# Patient Record
Sex: Female | Born: 1985 | Race: Black or African American | Hispanic: No | Marital: Single | Smoking: Current every day smoker
Health system: Southern US, Community
[De-identification: ages and names within clinical notes are randomized; demographics above are authoritative.]

---

## 2005-05-17 ENCOUNTER — Emergency Department: Payer: Self-pay | Admitting: Emergency Medicine

## 2006-01-13 ENCOUNTER — Emergency Department: Payer: Self-pay | Admitting: Internal Medicine

## 2006-04-06 ENCOUNTER — Emergency Department: Payer: Self-pay | Admitting: Emergency Medicine

## 2007-09-08 ENCOUNTER — Emergency Department: Payer: Self-pay | Admitting: Emergency Medicine

## 2010-06-13 ENCOUNTER — Emergency Department: Payer: Self-pay | Admitting: Emergency Medicine

## 2010-06-15 ENCOUNTER — Emergency Department: Payer: Self-pay | Admitting: Emergency Medicine

## 2011-10-13 ENCOUNTER — Emergency Department: Payer: Self-pay | Admitting: Unknown Physician Specialty

## 2011-10-20 ENCOUNTER — Emergency Department: Payer: Self-pay | Admitting: Emergency Medicine

## 2011-11-24 ENCOUNTER — Emergency Department: Payer: Self-pay | Admitting: Emergency Medicine

## 2011-11-24 LAB — CBC
HCT: 43.1 % (ref 35.0–47.0)
MCHC: 31.1 g/dL — ABNORMAL LOW (ref 32.0–36.0)
Platelet: 275 10*3/uL (ref 150–440)
RDW: 13.4 % (ref 11.5–14.5)
WBC: 9.2 10*3/uL (ref 3.6–11.0)

## 2011-11-24 LAB — COMPREHENSIVE METABOLIC PANEL
Albumin: 3.8 g/dL (ref 3.4–5.0)
Alkaline Phosphatase: 65 U/L (ref 50–136)
Anion Gap: 8 (ref 7–16)
BUN: 9 mg/dL (ref 7–18)
Co2: 24 mmol/L (ref 21–32)
Creatinine: 0.76 mg/dL (ref 0.60–1.30)
EGFR (African American): >60
Glucose: 80 mg/dL (ref 65–99)
Potassium: 4.2 mmol/L (ref 3.5–5.1)
SGOT(AST): 26 U/L (ref 15–37)
SGPT (ALT): 25 U/L
Total Protein: 8.1 g/dL (ref 6.4–8.2)

## 2011-11-24 LAB — URINALYSIS, COMPLETE
Bilirubin,UR: NEGATIVE
Glucose,UR: NEGATIVE mg/dL (ref 0–75)
Ketone: NEGATIVE
Leukocyte Esterase: NEGATIVE
Nitrite: NEGATIVE
Ph: 6 (ref 4.5–8.0)
WBC UR: 2 /HPF (ref 0–5)

## 2012-07-11 ENCOUNTER — Emergency Department: Payer: Self-pay | Admitting: Emergency Medicine

## 2012-07-11 LAB — COMPREHENSIVE METABOLIC PANEL
Alkaline Phosphatase: 73 U/L (ref 50–136)
BUN: 10 mg/dL (ref 7–18)
Bilirubin,Total: 0.3 mg/dL (ref 0.2–1.0)
Co2: 27 mmol/L (ref 21–32)
Creatinine: 0.7 mg/dL (ref 0.60–1.30)
Glucose: 80 mg/dL (ref 65–99)
Osmolality: 277 (ref 275–301)
SGOT(AST): 30 U/L (ref 15–37)
Sodium: 140 mmol/L (ref 136–145)
Total Protein: 8.4 g/dL — ABNORMAL HIGH (ref 6.4–8.2)

## 2012-07-11 LAB — CBC
HGB: 13.6 g/dL (ref 12.0–16.0)
MCHC: 32.7 g/dL (ref 32.0–36.0)
Platelet: 285 10*3/uL (ref 150–440)
WBC: 5.9 10*3/uL (ref 3.6–11.0)

## 2012-07-11 LAB — URINALYSIS, COMPLETE
Ketone: NEGATIVE
Specific Gravity: 1.029 (ref 1.003–1.030)

## 2013-01-19 ENCOUNTER — Emergency Department: Payer: Self-pay | Admitting: Emergency Medicine

## 2013-01-22 ENCOUNTER — Emergency Department: Payer: Self-pay | Admitting: Emergency Medicine

## 2013-07-10 ENCOUNTER — Ambulatory Visit: Payer: Self-pay | Admitting: Physician Assistant

## 2013-10-19 ENCOUNTER — Observation Stay: Payer: Self-pay

## 2013-10-23 ENCOUNTER — Inpatient Hospital Stay: Payer: Self-pay | Admitting: Obstetrics and Gynecology

## 2013-10-23 LAB — CBC WITH DIFFERENTIAL/PLATELET
Basophil #: 0.1 10*3/uL (ref 0.0–0.1)
Basophil %: 0.5 %
EOS PCT: 0.6 %
Eosinophil #: 0.1 10*3/uL (ref 0.0–0.7)
HCT: 39.8 % (ref 35.0–47.0)
HGB: 13.3 g/dL (ref 12.0–16.0)
Lymphocyte #: 2.7 10*3/uL (ref 1.0–3.6)
Lymphocyte %: 24.9 %
MCH: 30.6 pg (ref 26.0–34.0)
MCHC: 33.4 g/dL (ref 32.0–36.0)
MCV: 92 fL (ref 80–100)
MONOS PCT: 9.5 %
Monocyte #: 1 x10 3/mm — ABNORMAL HIGH (ref 0.2–0.9)
NEUTROS PCT: 64.5 %
Neutrophil #: 7.1 10*3/uL — ABNORMAL HIGH (ref 1.4–6.5)
PLATELETS: 174 10*3/uL (ref 150–440)
RBC: 4.33 10*6/uL (ref 3.80–5.20)
RDW: 13.5 % (ref 11.5–14.5)
WBC: 11 10*3/uL (ref 3.6–11.0)

## 2013-10-25 LAB — HEMATOCRIT: HCT: 34.8 % — ABNORMAL LOW (ref 35.0–47.0)

## 2013-10-28 ENCOUNTER — Emergency Department: Payer: Self-pay | Admitting: Emergency Medicine

## 2013-10-28 LAB — URINALYSIS, COMPLETE
BILIRUBIN, UR: NEGATIVE
Bacteria: NONE SEEN
Glucose,UR: NEGATIVE mg/dL (ref 0–75)
KETONE: NEGATIVE
Nitrite: NEGATIVE
PH: 6 (ref 4.5–8.0)
PROTEIN: NEGATIVE
RBC,UR: 15 /HPF (ref 0–5)
Specific Gravity: 1.017 (ref 1.003–1.030)
Squamous Epithelial: <1

## 2013-10-28 LAB — COMPREHENSIVE METABOLIC PANEL
Albumin: 2.7 g/dL — ABNORMAL LOW (ref 3.4–5.0)
Alkaline Phosphatase: 157 U/L — ABNORMAL HIGH
Anion Gap: 5 — ABNORMAL LOW (ref 7–16)
BILIRUBIN TOTAL: 0.3 mg/dL (ref 0.2–1.0)
BUN: 11 mg/dL (ref 7–18)
CO2: 25 mmol/L (ref 21–32)
Calcium, Total: 9.1 mg/dL (ref 8.5–10.1)
Chloride: 106 mmol/L (ref 98–107)
Creatinine: 0.51 mg/dL — ABNORMAL LOW (ref 0.60–1.30)
EGFR (African American): >60
EGFR (Non-African Amer.): >60
GLUCOSE: 94 mg/dL (ref 65–99)
OSMOLALITY: 271 (ref 275–301)
POTASSIUM: 3.6 mmol/L (ref 3.5–5.1)
SGOT(AST): 37 U/L (ref 15–37)
SGPT (ALT): 30 U/L (ref 12–78)
SODIUM: 136 mmol/L (ref 136–145)
Total Protein: 7.3 g/dL (ref 6.4–8.2)

## 2013-10-28 LAB — CBC
HCT: 36.8 % (ref 35.0–47.0)
HGB: 12.4 g/dL (ref 12.0–16.0)
MCH: 30.7 pg (ref 26.0–34.0)
MCHC: 33.7 g/dL (ref 32.0–36.0)
MCV: 91 fL (ref 80–100)
Platelet: 242 10*3/uL (ref 150–440)
RBC: 4.03 10*6/uL (ref 3.80–5.20)
RDW: 13.4 % (ref 11.5–14.5)
WBC: 10.3 10*3/uL (ref 3.6–11.0)

## 2013-10-28 LAB — URIC ACID: Uric Acid: 5 mg/dL (ref 2.6–6.0)

## 2013-10-28 LAB — LIPASE, BLOOD: LIPASE: 87 U/L (ref 73–393)

## 2013-10-28 LAB — MAGNESIUM: Magnesium: 1.6 mg/dL — ABNORMAL LOW
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
## 2014-10-20 NOTE — H&P (Signed)
L&D Evaluation:  History:  HPI 29 yo G3P1011 with LMP of ?5/15 & 10/28/13 at 24 2/7 weeks [facility] significant for Polyhydramnios, unsure dating, late entry to care, PICA, dental concerns, recent smoker, H/o seizure x 1 (2000), social stressors, Edinburgh score of 11. [facility].   Presents with contractions   Patient's Medical History Dental  issues, Seizure   Patient's Surgical History none   Medications Pre Natal Vitamins   Allergies NKDA   Social History none   Family History Non-Contributory   ROS:  ROS All systems were reviewed.  HEENT, CNS, GI, GU, Respiratory, CV, Renal and Musculoskeletal systems were found to be normal.   Exam:  Vital Signs stable   General no apparent distress   Mental Status clear   Chest clear   Heart normal sinus rhythm, no murmur/gallop/rubs   Estimated Fetal Weight Average for gestational age   Fetal Position vtx   Back no CVAT   Edema 1+   Mebranes Intact   Ucx irregular   Skin dry   Lymph no lymphadenopathy   Impression:  Impression IUP at 38 5/7 weeks   Plan:  Plan Pt is ambulating   Electronic Signatures: Sharee PimpleJones, Caron W (CNM)  (Signed 10-May-15 09:25)  Authored: L&D Evaluation   Last Updated: 10-May-15 09:25 by Sharee PimpleJones, Caron W (CNM)

## 2014-10-20 NOTE — H&P (Signed)
L&D Evaluation:  History:  HPI 29 yo G3P1011 wtih LMP of 01/23/13 & EDD of 10/28/13 at [facility] for unsure dating, late entry to care, PICA, dental concerns, H/O "seizure x 1 (2000), social stressors, [place]: 11 at entry of care, S>D. Presents this pm for IOL due to repeat visits for B-H's, pt preference and unsure dating. Pt initially was given a 10/09/13 EDD and it was changed to 10/24/13 & then 10/28/13. No ROM,VB, decreased FM or UC's. IOL was scheduled and approved by Dr Christen BameSchermerhron. Dr. Bonney AidStaebler aware of pt status and plan of care and on back-up/   Presents with IOL   Patient's Medical History Seizure x 1 in 2000, Anemia, social stressors , Depression   Patient's Surgical History none   Medications Pre Natal Vitamins   Allergies NKDA   Social History tobacco  EtOH  quit 03/2013   Family History Non-Contributory   ROS:  ROS All systems were reviewed.  HEENT, CNS, GI, GU, Respiratory, CV, Renal and Musculoskeletal systems were found to be normal.   Exam:  Vital Signs stable   General no apparent distress   Mental Status clear   Chest clear   Heart normal sinus rhythm, no murmur/gallop/rubs   Abdomen gravid, non-tender   Estimated Fetal Weight Average for gestational age   Fetal Position vtx -2   Back no CVAT   Edema 1+   Reflexes 1+   Clonus negative   Pelvic 3/80/vtx   FHT normal rate with no decels   Ucx irregular   Skin dry   Lymph no lymphadenopathy   Impression:  Impression IUP at term for IOL   Plan:  Plan monitor contractions and for cervical change, Disc IOL with AROM   Electronic Signatures: Sharee PimpleJones, Caron W (CNM)  (Signed 14-May-15 22:04)  Authored: L&D Evaluation   Last Updated: 14-May-15 22:04 by Sharee PimpleJones, Caron W (CNM)

## 2014-11-02 ENCOUNTER — Emergency Department
Admission: EM | Admit: 2014-11-02 | Discharge: 2014-11-02 | Disposition: A | Discharge status: Home / Self Care | Payer: Self-pay | Attending: Emergency Medicine | Admitting: Emergency Medicine

## 2014-11-02 ENCOUNTER — Encounter: Payer: Self-pay | Admitting: *Deleted

## 2014-11-02 DIAGNOSIS — H109 Unspecified conjunctivitis: Secondary | ICD-10-CM | POA: Insufficient documentation

## 2014-11-02 DIAGNOSIS — Z720 Tobacco use: Secondary | ICD-10-CM | POA: Insufficient documentation

## 2014-11-02 DIAGNOSIS — K047 Periapical abscess without sinus: Secondary | ICD-10-CM | POA: Insufficient documentation

## 2014-11-02 MED ORDER — SULFACETAMIDE SODIUM 10 % OP SOLN: 2.0000 [drp] | Freq: Four times a day (QID) | OPHTHALMIC | Status: DC

## 2014-11-02 MED ORDER — HYDROCODONE-ACETAMINOPHEN 5-325 MG PO TABS: 1.0000 | ORAL_TABLET | ORAL | Status: DC | PRN

## 2014-11-02 MED ORDER — AMOXICILLIN 500 MG PO TABS: 500.0000 mg | ORAL_TABLET | Freq: Three times a day (TID) | ORAL | Status: DC

## 2014-11-02 MED ORDER — IBUPROFEN 800 MG PO TABS
ORAL_TABLET | ORAL | Status: AC
  Administered 2014-11-02: 800 mg via ORAL
  Filled 2014-11-02: qty 1

## 2014-11-02 MED ORDER — HYDROCODONE-ACETAMINOPHEN 5-325 MG PO TABS
ORAL_TABLET | ORAL | Status: AC
  Filled 2014-11-02: qty 2

## 2014-11-02 MED ORDER — IBUPROFEN 800 MG PO TABS: 800.0000 mg | ORAL_TABLET | Freq: Three times a day (TID) | ORAL | Status: DC | PRN

## 2014-11-02 MED ORDER — IBUPROFEN 800 MG PO TABS
800.0000 mg | ORAL_TABLET | Freq: Once | ORAL | Status: AC
  Administered 2014-11-02: 800 mg via ORAL

## 2014-11-02 MED ORDER — HYDROCODONE-ACETAMINOPHEN 5-325 MG PO TABS
2.0000 | ORAL_TABLET | Freq: Once | ORAL | Status: AC
  Administered 2014-11-02: 2 via ORAL

## 2014-11-02 NOTE — ED Notes (Signed)
Pt c/o dental pain for years, worsening last night at 2200, unable to sleep. Pt states she took Excedrin x 6 tabs w/o relief in the past 7 hrs.

## 2014-11-02 NOTE — ED Provider Notes (Signed)
[facility] Emergency Department Provider Note  ____________________________________________  Time seen: Approximately 8:04 AM  I have reviewed the triage vital signs and the nursing notes.   HISTORY  Chief Complaint Dental Pain    HPI Maria Gillespie is a 29 y.o. female presents for evaluation of dental pain. States had sudden onset last night around 10:00 unable to sleep all night. Rates pain as a 10 out of 10. In addition patient notes that her right eyes. Red with drainage and matting. That was sudden onset this morning  History reviewed. No pertinent past medical history.  There are no active problems to display for this patient.   History reviewed. No pertinent past surgical history.  Current Outpatient Rx  Name  Route  Sig  Dispense  Refill  . amoxicillin (AMOXIL) 500 MG tablet   Oral   Take 1 tablet (500 mg total) by mouth 3 (three) times daily.   30 tablet   0   . HYDROcodone-acetaminophen (NORCO) 5-325 MG per tablet   Oral   Take 1 tablet by mouth every 4 (four) hours as needed for moderate pain.   12 tablet   0   . ibuprofen (ADVIL,MOTRIN) 800 MG tablet   Oral   Take 1 tablet (800 mg total) by mouth every 8 (eight) hours as needed.   30 tablet   0   . sulfacetamide (BLEPH-10) 10 % ophthalmic solution   Both Eyes   Place 2 drops into both eyes 4 (four) times daily.   5 mL   0     Allergies Review of patient's allergies indicates no known allergies.  History reviewed. No pertinent family history.  Social History History  Substance Use Topics  . Smoking status: Current Every Day Smoker    Types: Cigarettes  . Smokeless tobacco: Never Used  . Alcohol Use: Yes     Comment: 1 shot last night, usually only drinks occasionally    Review of Systems Constitutional: No fever/chills Eyes: No visual changes. Erythematous eyes  ENT: No sore throat. Positive dental pain. Cardiovascular: Denies chest pain. Respiratory: Denies  shortness of breath. Genitourinary: Negative for dysuria. Musculoskeletal: Negative for back pain. Skin: Negative for rash. Neurological: Negative for headaches, focal weakness or numbness.  10-point ROS otherwise negative.  ____________________________________________   PHYSICAL EXAM:  VITAL SIGNS: ED Triage Vitals  Enc Vitals Group     BP 11/02/14 0525 108/69 mmHg     Pulse Rate 11/02/14 0525 61     Resp 11/02/14 0525 20     Temp 11/02/14 0525 98.9 F (37.2 C)     Temp Source 11/02/14 0525 Oral     SpO2 11/02/14 0525 99 %     Weight --      Height 11/02/14 0525  (1.803 m)     Head Cir --      Peak Flow --      Pain Score 11/02/14 0526 10     Pain Loc --      Pain Edu? --      Excl. in GC? --     Constitutional: Alert and oriented. Well appearing and in no acute distress. Eyes: Right eye very erythematous with greenish drainage noted. Pupils equal round reactive to light and accommodation EOMI Mouth/Throat: Mucous membranes are moist.  Oropharynx non-erythematous. Multiple dental caries noted. With some gum irritation erythema. Neck: No stridor.  No cervical adenopathy. Neurologic:  Normal speech and language. No gross focal neurologic deficits are appreciated. Speech  is normal. No gait instability. Skin:  Skin is warm, dry and intact. No rash noted. Psychiatric: Mood and affect are normal. Speech and behavior are normal.  ____________________________________________   LABS (all labs ordered are listed, but only abnormal results are displayed)  Labs Reviewed - No data to display ____________________________________________  EKG  None ____________________________________________  RADIOLOGY  None ____________________________________________   PROCEDURES  Procedure(s) performed: None  Critical Care performed: No  ____________________________________________   INITIAL IMPRESSION / ASSESSMENT AND PLAN / ED COURSE  Pertinent labs & imaging  results that were available during my care of the patient were reviewed by me and considered in my medical decision making (see chart for details).  Diagnosis with dental caries. Plan will be to treat with amoxicillin and hydrocodone ibuprofen for pain. In addition patient has conjunctivitis and treated with sodium Sulamyd eyedrops. She understands return to the ER if symptoms worsen. ____________________________________________   FINAL CLINICAL IMPRESSION(S) / ED DIAGNOSES  Final diagnoses:  Dental abscess  Conjunctivitis of right eye      Evangeline DakinCharles M Tashauna Caisse, PA-C 11/02/14 1512  Jene Everyobert Kinner, MD 11/04/14 1258

## 2014-11-02 NOTE — Discharge Instructions (Signed)
Abscessed Tooth An abscessed tooth is an infection around your tooth. It may be caused by holes or damage to the tooth (cavity) or a dental disease. An abscessed tooth causes mild to very bad pain in and around the tooth. See your dentist right away if you have tooth or gum pain. HOME CARE  Take your medicine as told. Finish it even if you start to feel better.  Do not drive after taking pain medicine.  Rinse your mouth (gargle) often with salt water ( teaspoon salt in 8 ounces of warm water).  Do not apply heat to the outside of your face. GET HELP RIGHT AWAY IF:   You have a temperature by mouth above 102 F (38.9 C), not controlled by medicine.  You have chills and a very bad headache.  You have problems breathing or swallowing.  Your mouth will not open.  You develop puffiness (swelling) on the neck or around the eye.  Your pain is not helped by medicine.  Your pain is getting worse instead of better. MAKE SURE YOU:   Understand these instructions.  Will watch your condition.  Will get help right away if you are not doing well or get worse. Document Released: 11/15/2007 Document Revised: 08/21/2011 Document Reviewed: 09/06/2010 [facility] Patient Information 2015 [place], [place]. This information is not intended to replace advice given to you by your health care provider. Make sure you discuss any questions you have with your health care provider.  Conjunctivitis Conjunctivitis is commonly called "pink eye." Conjunctivitis can be caused by bacterial or viral infection, allergies, or injuries. There is usually redness of the lining of the eye, itching, discomfort, and sometimes discharge. There may be deposits of matter along the eyelids. A viral infection usually causes a watery discharge, while a bacterial infection causes a yellowish, thick discharge. Pink eye is very contagious and spreads by direct contact. You may be given antibiotic eyedrops as part of your treatment.  Before using your eye medicine, remove all drainage from the eye by washing gently with warm water and cotton balls. Continue to use the medication until you have awakened 2 mornings in a row without discharge from the eye. Do not rub your eye. This increases the irritation and helps spread infection. Use separate towels from other household members. Wash your hands with soap and water before and after touching your eyes. Use cold compresses to reduce pain and sunglasses to relieve irritation from light. Do not wear contact lenses or wear eye makeup until the infection is gone. SEEK MEDICAL CARE IF:   Your symptoms are not better after 3 days of treatment.  You have increased pain or trouble seeing.  The outer eyelids become very red or swollen. Document Released: 07/06/2004 Document Revised: 08/21/2011 Document Reviewed: 05/29/2005 [facility] Patient Information 2015 [place], [place]. This information is not intended to replace advice given to you by your health care provider. Make sure you discuss any questions you have with your health care provider.

## 2014-11-05 MED ORDER — METOCLOPRAMIDE HCL 5 MG/ML IJ SOLN
INTRAMUSCULAR | Status: AC
  Filled 2014-11-05: qty 2

## 2015-08-10 ENCOUNTER — Encounter: Payer: Self-pay | Admitting: Emergency Medicine

## 2015-08-10 ENCOUNTER — Emergency Department
Admission: EM | Admit: 2015-08-10 | Discharge: 2015-08-10 | Disposition: A | Discharge status: Home / Self Care | Payer: Self-pay | Attending: Emergency Medicine | Admitting: Emergency Medicine

## 2015-08-10 DIAGNOSIS — F17210 Nicotine dependence, cigarettes, uncomplicated: Secondary | ICD-10-CM | POA: Insufficient documentation

## 2015-08-10 DIAGNOSIS — R197 Diarrhea, unspecified: Secondary | ICD-10-CM | POA: Insufficient documentation

## 2015-08-10 DIAGNOSIS — R112 Nausea with vomiting, unspecified: Secondary | ICD-10-CM | POA: Insufficient documentation

## 2015-08-10 DIAGNOSIS — B9789 Other viral agents as the cause of diseases classified elsewhere: Secondary | ICD-10-CM | POA: Insufficient documentation

## 2015-08-10 DIAGNOSIS — A0811 Acute gastroenteropathy due to Norwalk agent: Secondary | ICD-10-CM

## 2015-08-10 DIAGNOSIS — Z792 Long term (current) use of antibiotics: Secondary | ICD-10-CM | POA: Insufficient documentation

## 2015-08-10 LAB — CBC WITH DIFFERENTIAL/PLATELET
BASOS ABS: 0 10*3/uL (ref 0–0.1)
Basophils Relative: 0 %
Eosinophils Absolute: 0 10*3/uL (ref 0–0.7)
Eosinophils Relative: 0 %
HEMATOCRIT: 46.4 % (ref 35.0–47.0)
HEMOGLOBIN: 15.6 g/dL (ref 12.0–16.0)
LYMPHS ABS: 0.8 10*3/uL — AB (ref 1.0–3.6)
LYMPHS PCT: 7 %
MCH: 29.6 pg (ref 26.0–34.0)
MCHC: 33.6 g/dL (ref 32.0–36.0)
MCV: 88.1 fL (ref 80.0–100.0)
Monocytes Absolute: 0.5 10*3/uL (ref 0.2–0.9)
Monocytes Relative: 5 %
NEUTROS ABS: 10 10*3/uL — AB (ref 1.4–6.5)
NEUTROS PCT: 88 %
Platelets: 263 10*3/uL (ref 150–440)
RBC: 5.27 MIL/uL — AB (ref 3.80–5.20)
RDW: 13.3 % (ref 11.5–14.5)
WBC: 11.4 10*3/uL — AB (ref 3.6–11.0)

## 2015-08-10 LAB — COMPREHENSIVE METABOLIC PANEL
ALBUMIN: 4.3 g/dL (ref 3.5–5.0)
ALT: 32 U/L (ref 14–54)
AST: 31 U/L (ref 15–41)
Alkaline Phosphatase: 51 U/L (ref 38–126)
Anion gap: 9 (ref 5–15)
BUN: 12 mg/dL (ref 6–20)
CHLORIDE: 108 mmol/L (ref 101–111)
CO2: 21 mmol/L — ABNORMAL LOW (ref 22–32)
Calcium: 9 mg/dL (ref 8.9–10.3)
Creatinine, Ser: 0.78 mg/dL (ref 0.44–1.00)
Glucose, Bld: 101 mg/dL — ABNORMAL HIGH (ref 65–99)
Potassium: 3.8 mmol/L (ref 3.5–5.1)
Sodium: 138 mmol/L (ref 135–145)
TOTAL PROTEIN: 8.4 g/dL — AB (ref 6.5–8.1)
Total Bilirubin: 0.7 mg/dL (ref 0.3–1.2)

## 2015-08-10 MED ORDER — ONDANSETRON 4 MG PO TBDP: 4.0000 mg | ORAL_TABLET | Freq: Three times a day (TID) | ORAL | Status: DC | PRN

## 2015-08-10 MED ORDER — SODIUM CHLORIDE 0.9 % IV BOLUS (SEPSIS)
1000.0000 mL | Freq: Once | INTRAVENOUS | Status: AC
  Administered 2015-08-10: 1000 mL via INTRAVENOUS

## 2015-08-10 MED ORDER — LOPERAMIDE HCL 2 MG PO TABS: 2.0000 mg | ORAL_TABLET | Freq: Four times a day (QID) | ORAL | Status: DC | PRN

## 2015-08-10 MED ORDER — ONDANSETRON HCL 4 MG/2ML IJ SOLN
4.0000 mg | Freq: Once | INTRAMUSCULAR | Status: AC
  Administered 2015-08-10: 4 mg via INTRAVENOUS
  Filled 2015-08-10: qty 2

## 2015-08-10 NOTE — ED Provider Notes (Signed)
[facility] Emergency Department Provider Note  ____________________________________________  Time seen: Approximately 11:00 AM  I have reviewed the triage vital signs and the nursing notes.   HISTORY  Chief Complaint Emesis and Diarrhea    HPI Maria Gillespie is a 30 y.o. female presents emergency department complaining of nausea, vomiting, diarrhea began this morning. Patient states that she had no symptoms yesterday but all started around 5:30 this morning. Patient denies any abdominal pain or fevers or chills with this. She states that the vomiting is nonbilious. Diarrhea is not mucoid and nonbloody.   History reviewed. No pertinent past medical history.  There are no active problems to display for this patient.   History reviewed. No pertinent past surgical history.  Current Outpatient Rx  Name  Route  Sig  Dispense  Refill  . amoxicillin (AMOXIL) 500 MG tablet   Oral   Take 1 tablet (500 mg total) by mouth 3 (three) times daily.   30 tablet   0   . HYDROcodone-acetaminophen (NORCO) 5-325 MG per tablet   Oral   Take 1 tablet by mouth every 4 (four) hours as needed for moderate pain.   12 tablet   0   . ibuprofen (ADVIL,MOTRIN) 800 MG tablet   Oral   Take 1 tablet (800 mg total) by mouth every 8 (eight) hours as needed.   30 tablet   0   . loperamide (IMODIUM A-D) 2 MG tablet   Oral   Take 1 tablet (2 mg total) by mouth 4 (four) times daily as needed for diarrhea or loose stools.   30 tablet   0   . ondansetron (ZOFRAN-ODT) 4 MG disintegrating tablet   Oral   Take 1 tablet (4 mg total) by mouth every 8 (eight) hours as needed for nausea or vomiting.   20 tablet   0   . sulfacetamide (BLEPH-10) 10 % ophthalmic solution   Both Eyes   Place 2 drops into both eyes 4 (four) times daily.   5 mL   0     Allergies Review of patient's allergies indicates no known allergies.  No family history on file.  Social History Social  History  Substance Use Topics  . Smoking status: Current Every Day Smoker -- 0.50 packs/day    Types: Cigarettes  . Smokeless tobacco: Never Used  . Alcohol Use: Yes     Comment: occas.      Review of Systems  Constitutional: No fever/chills ENT: No sore throat. Cardiovascular: no chest pain. Respiratory: no cough. No SOB. Gastrointestinal: No abdominal pain.  Positive for nausea/vomiting and diarrhea..  No constipation. Genitourinary: Negative for dysuria. No hematuria Musculoskeletal: Negative for back pain. Skin: Negative for rash. Neurological: Negative for headaches, focal weakness or numbness. 10-point ROS otherwise negative.  ____________________________________________   PHYSICAL EXAM:  VITAL SIGNS: ED Triage Vitals  Enc Vitals Group     BP 08/10/15 0933 111/55 mmHg     Pulse Rate 08/10/15 0933 78     Resp 08/10/15 0933 18     Temp 08/10/15 0933 98.4 F (36.9 C)     Temp Source 08/10/15 0933 Oral     SpO2 08/10/15 0933 100 %     Weight 08/10/15 0928 150 lb (68.04 kg)     Height 08/10/15 0928  (1.778 m)     Head Cir --      Peak Flow --      Pain Score 08/10/15 0928 8  Pain Loc --      Pain Edu? --      Excl. in GC? --      Constitutional: Alert and oriented. Well appearing and in no acute distress. Eyes: Conjunctivae are normal. PERRL. EOMI. Head: Atraumatic. Neck: No stridor.   Hematological/Lymphatic/Immunilogical: No cervical lymphadenopathy. Cardiovascular: Normal rate, regular rhythm. Normal S1 and S2.  Good peripheral circulation. Respiratory: Normal respiratory effort without tachypnea or retractions. Lungs CTAB. Gastrointestinal: Bowel sounds 4 quadrants. Soft and nontender. No guarding or rigidity. No distention. No CVA tenderness. Neurologic:  Normal speech and language. No gross focal neurologic deficits are appreciated.  Skin:  Skin is warm, dry and intact. No rash noted. Psychiatric: Mood and affect are normal. Speech and  behavior are normal. Patient exhibits appropriate insight and judgement.   ____________________________________________   LABS (all labs ordered are listed, but only abnormal results are displayed)  Labs Reviewed  COMPREHENSIVE METABOLIC PANEL - Abnormal; Notable for the following:    CO2 21 (*)    Glucose, Bld 101 (*)    Total Protein 8.4 (*)    All other components within normal limits  CBC WITH DIFFERENTIAL/PLATELET - Abnormal; Notable for the following:    WBC 11.4 (*)    RBC 5.27 (*)    Neutro Abs 10.0 (*)    Lymphs Abs 0.8 (*)    All other components within normal limits   ____________________________________________  EKG   ____________________________________________  RADIOLOGY   No results found.  ____________________________________________    PROCEDURES  Procedure(s) performed:       Medications  sodium chloride 0.9 % bolus 1,000 mL (1,000 mLs Intravenous New Bag/Given 08/10/15 1011)  ondansetron (ZOFRAN) injection 4 mg (4 mg Intravenous Given 08/10/15 1011)     ____________________________________________   INITIAL IMPRESSION / ASSESSMENT AND PLAN / ED COURSE  Pertinent labs & imaging results that were available during my care of the patient were reviewed by me and considered in my medical decision making (see chart for details).  Patient's diagnosis is consistent with normal virus. Patient's exam and labs are reassuring. Patient was given fluids and Zofran here in the emergency department with improvement of her symptoms.. Patient will be discharged home with prescriptions for Zofran and loperamide. Patient is to take probiotics over-the-counter for additional symptom control.. Patient is to follow up with primary care provider if symptoms persist past this treatment course. Patient is given ED precautions to return to the ED for any worsening or new symptoms.     ____________________________________________  FINAL CLINICAL IMPRESSION(S) /  ED DIAGNOSES  Final diagnoses:  Norovirus      NEW MEDICATIONS STARTED DURING THIS VISIT:  New Prescriptions   LOPERAMIDE (IMODIUM A-D) 2 MG TABLET    Take 1 tablet (2 mg total) by mouth 4 (four) times daily as needed for diarrhea or loose stools.   ONDANSETRON (ZOFRAN-ODT) 4 MG DISINTEGRATING TABLET    Take 1 tablet (4 mg total) by mouth every 8 (eight) hours as needed for nausea or vomiting.        Delorise Royals Cuthriell, PA-C 08/10/15 1104  Myrna Blazer, MD 08/10/15 [phone]

## 2015-08-10 NOTE — Discharge Instructions (Signed)
Norovirus Infection A norovirus infection is caused by exposure to a virus in a group of similar viruses (noroviruses). This type of infection causes inflammation in your stomach and intestines (gastroenteritis). Norovirus is the most common cause of gastroenteritis. It also causes food poisoning. Anyone can get a norovirus infection. It spreads very easily (contagious). You can get it from contaminated food, water, surfaces, or other people. Norovirus is found in the stool or vomit of infected people. You can spread the infection as soon as you feel sick until 2 weeks after you recover.  Symptoms usually begin within 2 days after you become infected. Most norovirus symptoms affect the digestive system. CAUSES Norovirus infection is caused by contact with norovirus. You can catch norovirus if you:  Eat or drink something contaminated with norovirus.  Touch surfaces or objects contaminated with norovirus and then put your hand in your mouth.  Have direct contact with an infected person who has symptoms.  Share food, drink, or utensils with someone with who is sick with norovirus. SIGNS AND SYMPTOMS Symptoms of norovirus may include:  Nausea.  Vomiting.  Diarrhea.  Stomach cramps.  Fever.  Chills.  Headache.  Muscle aches.  Tiredness. DIAGNOSIS Your health care provider may suspect norovirus based on your symptoms and physical exam. Your health care provider may also test a sample of your stool or vomit for the virus.  TREATMENT There is no specific treatment for norovirus. Most people get better without treatment in about 2 days. HOME CARE INSTRUCTIONS  Replace lost fluids by drinking plenty of water or rehydration fluids containing important minerals called electrolytes. This prevents dehydration. Drink enough fluid to keep your urine clear or pale yellow.  Do not prepare food for others while you are infected. Wait at least 3 days after recovering from the illness to do  that. PREVENTION   Wash your hands often, especially after using the toilet or changing a diaper.  Wash fruits and vegetables thoroughly before preparing or serving them.  Throw out any food that a sick person may have touched.  Disinfect contaminated surfaces immediately after someone in the household has been sick. Use a bleach-based household cleaner.  Immediately remove and wash soiled clothes or sheets. SEEK MEDICAL CARE IF:  Your vomiting, diarrhea, and stomach pain is getting worse.  Your symptoms of norovirus do not go away after 2-3 days. SEEK IMMEDIATE MEDICAL CARE IF:  You develop symptoms of dehydration that do not improve with fluid replacement. This may include:  Excessive sleepiness.  Lack of tears.  Dry mouth.  Dizziness when standing.  Weak pulse.   This information is not intended to replace advice given to you by your health care provider. Make sure you discuss any questions you have with your health care provider.   Document Released: 08/19/2002 Document Revised: 06/19/2014 Document Reviewed: 11/06/2013 Elsevier Interactive Patient Education 2016 [place] Choices to Help Relieve Diarrhea, Adult When you have diarrhea, the foods you eat and your eating habits are very important. Choosing the right foods and drinks can help relieve diarrhea. Also, because diarrhea can last up to 7 days, you need to replace lost fluids and electrolytes (such as sodium, potassium, and chloride) in order to help prevent dehydration.  WHAT GENERAL GUIDELINES DO I NEED TO FOLLOW?  Slowly drink 1 cup (8 oz) of fluid for each episode of diarrhea. If you are getting enough fluid, your urine will be clear or pale yellow.  Eat starchy foods. Some good choices  include white rice, white toast, pasta, low-fiber cereal, baked potatoes (without the skin), saltine crackers, and bagels.  Avoid large servings of any cooked vegetables.  Limit fruit to two servings per day. A  serving is  cup or 1 small piece.  Choose foods with less than 2 g of fiber per serving.  Limit fats to less than 8 tsp (38 g) per day.  Avoid fried foods.  Eat foods that have probiotics in them. Probiotics can be found in certain dairy products.  Avoid foods and beverages that may increase the speed at which food moves through the stomach and intestines (gastrointestinal tract). Things to avoid include:  High-fiber foods, such as dried fruit, raw fruits and vegetables, nuts, seeds, and whole grain foods.  Spicy foods and high-fat foods.  Foods and beverages sweetened with high-fructose corn syrup, honey, or sugar alcohols such as xylitol, sorbitol, and mannitol. WHAT FOODS ARE RECOMMENDED? Grains White rice. White, [place], or pita breads (fresh or toasted), including plain rolls, buns, or bagels. White pasta. Saltine, soda, or graham crackers. Pretzels. Low-fiber cereal. Cooked cereals made with water (such as cornmeal, farina, or cream cereals). Plain muffins. Matzo. Melba toast. Zwieback.  Vegetables Potatoes (without the skin). Strained tomato and vegetable juices. Most well-cooked and canned vegetables without seeds. Tender lettuce. Fruits Cooked or canned applesauce, apricots, cherries, fruit cocktail, grapefruit, peaches, pears, or plums. Fresh bananas, apples without skin, cherries, grapes, cantaloupe, grapefruit, peaches, oranges, or plums.  Meat and Other Protein Products Baked or boiled chicken. Eggs. Tofu. Fish. Seafood. Smooth peanut butter. Ground or well-cooked tender beef, ham, veal, lamb, pork, or poultry.  Dairy Plain yogurt, kefir, and unsweetened liquid yogurt. Lactose-free milk, buttermilk, or soy milk. Plain hard cheese. Beverages Sport drinks. Clear broths. Diluted fruit juices (except prune). Regular, caffeine-free sodas such as ginger ale. Water. Decaffeinated teas. Oral rehydration solutions. Sugar-free beverages not sweetened with sugar  alcohols. Other Bouillon, broth, or soups made from recommended foods.  The items listed above may not be a complete list of recommended foods or beverages. Contact your dietitian for more options. WHAT FOODS ARE NOT RECOMMENDED? Grains Whole grain, whole wheat, bran, or rye breads, rolls, pastas, crackers, and cereals. Wild or brown rice. Cereals that contain more than 2 g of fiber per serving. Corn tortillas or taco shells. Cooked or dry oatmeal. Granola. Popcorn. Vegetables Raw vegetables. Cabbage, broccoli, Brussels sprouts, artichokes, baked beans, beet greens, corn, kale, legumes, peas, sweet potatoes, and yams. Potato skins. Cooked spinach and cabbage. Fruits Dried fruit, including raisins and dates. Raw fruits. Stewed or dried prunes. Fresh apples with skin, apricots, mangoes, pears, raspberries, and strawberries.  Meat and Other Protein Products Chunky peanut butter. Nuts and seeds. Beans and lentils. Berniece Salines.  Dairy High-fat cheeses. Milk, chocolate milk, and beverages made with milk, such as milk shakes. Cream. Ice cream. Sweets and Desserts Sweet rolls, doughnuts, and sweet breads. Pancakes and waffles. Fats and Oils Butter. Cream sauces. Margarine. Salad oils. Plain salad dressings. Olives. Avocados.  Beverages Caffeinated beverages (such as coffee, tea, soda, or energy drinks). Alcoholic beverages. Fruit juices with pulp. Prune juice. Soft drinks sweetened with high-fructose corn syrup or sugar alcohols. Other Coconut. Hot sauce. Chili powder. Mayonnaise. Gravy. Cream-based or milk-based soups.  The items listed above may not be a complete list of foods and beverages to avoid. Contact your dietitian for more information. WHAT SHOULD I DO IF I BECOME DEHYDRATED? Diarrhea can sometimes lead to dehydration. Signs of dehydration include dark urine and dry  mouth and skin. If you think you are dehydrated, you should rehydrate with an oral rehydration solution. These solutions can be  purchased at pharmacies, retail stores, or online.  Drink -1 cup (120-240 mL) of oral rehydration solution each time you have an episode of diarrhea. If drinking this amount makes your diarrhea worse, try drinking smaller amounts more often. For example, drink 1-3 tsp (5-15 mL) every 5-10 minutes.  A general rule for staying hydrated is to drink 1-2 L of fluid per day. Talk to your health care provider about the specific amount you should be drinking each day. Drink enough fluids to keep your urine clear or pale yellow.   This information is not intended to replace advice given to you by your health care provider. Make sure you discuss any questions you have with your health care provider.   Document Released: 08/19/2003 Document Revised: 06/19/2014 Document Reviewed: 04/21/2013 Elsevier Interactive Patient Education [employer]

## 2015-08-10 NOTE — ED Notes (Signed)
N,V,D that began this am. No vomiting in triage.

## 2017-02-02 ENCOUNTER — Emergency Department
Admission: EM | Admit: 2017-02-02 | Discharge: 2017-02-02 | Disposition: A | Discharge status: Home / Self Care | Payer: No Typology Code available for payment source | Attending: Emergency Medicine | Admitting: Emergency Medicine

## 2017-02-02 ENCOUNTER — Encounter: Payer: Self-pay | Admitting: Emergency Medicine

## 2017-02-02 DIAGNOSIS — F17210 Nicotine dependence, cigarettes, uncomplicated: Secondary | ICD-10-CM | POA: Insufficient documentation

## 2017-02-02 DIAGNOSIS — Y9389 Activity, other specified: Secondary | ICD-10-CM | POA: Insufficient documentation

## 2017-02-02 DIAGNOSIS — Y929 Unspecified place or not applicable: Secondary | ICD-10-CM | POA: Diagnosis not present

## 2017-02-02 DIAGNOSIS — Y998 Other external cause status: Secondary | ICD-10-CM | POA: Insufficient documentation

## 2017-02-02 DIAGNOSIS — Z041 Encounter for examination and observation following transport accident: Secondary | ICD-10-CM | POA: Diagnosis present

## 2017-02-02 DIAGNOSIS — G44209 Tension-type headache, unspecified, not intractable: Secondary | ICD-10-CM | POA: Diagnosis not present

## 2017-02-02 DIAGNOSIS — G44201 Tension-type headache, unspecified, intractable: Secondary | ICD-10-CM

## 2017-02-02 MED ORDER — CYCLOBENZAPRINE HCL 10 MG PO TABS
10.0000 mg | ORAL_TABLET | Freq: Once | ORAL | Status: AC
  Administered 2017-02-02: 10 mg via ORAL

## 2017-02-02 MED ORDER — CYCLOBENZAPRINE HCL 10 MG PO TABS
ORAL_TABLET | ORAL | Status: AC
  Administered 2017-02-02: 10 mg via ORAL
  Filled 2017-02-02: qty 1

## 2017-02-02 MED ORDER — BUTALBITAL-APAP-CAFFEINE 50-325-40 MG PO TABS: 1.0000 | ORAL_TABLET | Freq: Four times a day (QID) | ORAL | 0 refills | Status: AC | PRN

## 2017-02-02 MED ORDER — NAPROXEN 500 MG PO TABS: 500.0000 mg | ORAL_TABLET | Freq: Two times a day (BID) | ORAL | 0 refills | Status: DC
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
## 2017-02-02 NOTE — Discharge Instructions (Signed)
Follow up with the primary care provider of your choice for symptoms that are not improving over the next few days.  Return to the ER for symptoms that change or worsen if unable to schedule an appointment. 

## 2017-02-02 NOTE — ED Notes (Signed)
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
## 2018-12-03 ENCOUNTER — Telehealth: Payer: Self-pay | Admitting: General Practice

## 2018-12-03 ENCOUNTER — Telehealth: Payer: Self-pay

## 2018-12-03 ENCOUNTER — Other Ambulatory Visit: Payer: Self-pay

## 2018-12-03 DIAGNOSIS — Z20822 Contact with and (suspected) exposure to covid-19: Secondary | ICD-10-CM

## 2018-12-03 NOTE — Telephone Encounter (Signed)
Called patient, advised her that letter was sent to the requested fax.

## 2018-12-03 NOTE — Telephone Encounter (Signed)
Maria Gillespie with [facility] Dept. Request COVID 19 test due to symptoms.

## 2018-12-03 NOTE — Telephone Encounter (Signed)
Patient called requesting the generic letter stating when she had covid testing to provide to her job. She would like this faxed to [place]: Emily, HR.

## 2018-12-07 LAB — NOVEL CORONAVIRUS, NAA: SARS-CoV-2, NAA: NOT DETECTED

## 2019-01-21 ENCOUNTER — Other Ambulatory Visit: Payer: Self-pay

## 2019-01-21 DIAGNOSIS — Z20822 Contact with and (suspected) exposure to covid-19: Secondary | ICD-10-CM

## 2019-01-22 LAB — NOVEL CORONAVIRUS, NAA: SARS-CoV-2, NAA: NOT DETECTED

## 2019-01-23 ENCOUNTER — Telehealth: Payer: Self-pay | Admitting: General Practice

## 2019-01-23 NOTE — Telephone Encounter (Signed)
Patient called and received covid test result °

## 2019-03-13 ENCOUNTER — Other Ambulatory Visit: Payer: Self-pay

## 2019-03-13 DIAGNOSIS — Z20822 Contact with and (suspected) exposure to covid-19: Secondary | ICD-10-CM

## 2019-03-14 LAB — NOVEL CORONAVIRUS, NAA: SARS-CoV-2, NAA: NOT DETECTED

## 2019-04-06 ENCOUNTER — Emergency Department
Admission: EM | Admit: 2019-04-06 | Discharge: 2019-04-06 | Disposition: A | Discharge status: Home / Self Care | Payer: BC Managed Care – PPO | Attending: Emergency Medicine | Admitting: Emergency Medicine

## 2019-04-06 ENCOUNTER — Other Ambulatory Visit: Payer: Self-pay

## 2019-04-06 DIAGNOSIS — F17210 Nicotine dependence, cigarettes, uncomplicated: Secondary | ICD-10-CM | POA: Insufficient documentation

## 2019-04-06 DIAGNOSIS — L03317 Cellulitis of buttock: Secondary | ICD-10-CM | POA: Insufficient documentation

## 2019-04-06 MED ORDER — SULFAMETHOXAZOLE-TRIMETHOPRIM 800-160 MG PO TABS: 1.0000 | ORAL_TABLET | Freq: Two times a day (BID) | ORAL | 0 refills | Status: AC

## 2019-04-06 NOTE — ED Triage Notes (Signed)
Pt presents via POV c/o abscess in gluteal folds with pain. No drainage noted currently.

## 2019-04-06 NOTE — Discharge Instructions (Signed)
Take Bactrim twice daily for the next 7 days.  Continue to take antibiotics even if cellulitis improves. Use warm compresses in the affected area. There is a good chance that you will need an incision and drainage type procedure.  If symptoms worsen, please return to the emergency department for incision and drainage

## 2019-04-06 NOTE — ED Provider Notes (Signed)
[facility] Emergency Department Provider Note  ____________________________________________  Time seen: Approximately 3:42 PM  I have reviewed the triage vital signs and the nursing notes.   HISTORY  Chief Complaint Abscess    HPI Maria Gillespie is a 33 y.o. female presents to the emergency department with approximately 2 cm x 2 cm region of left-sided gluteal cellulitis.  Patient states that affected area has been present for the past 5 to 6 days.  She has been increasingly uncomfortable with sitting and with ambulation.  She describes a history of cutaneous abscesses with incision and drainage of the right axilla in the past.  Patient adamantly declines any type of incision and drainage procedure in the emergency department.  No other alleviating measures have been attempted.        History reviewed. No pertinent past medical history.  There are no active problems to display for this patient.   History reviewed. No pertinent surgical history.  Prior to Admission medications   Medication Sig Start Date End Date Taking? Authorizing Provider  amoxicillin (AMOXIL) 500 MG tablet Take 1 tablet (500 mg total) by mouth 3 (three) times daily. 11/02/14   Beers, Pierce Crane, PA-C  HYDROcodone-acetaminophen (NORCO) 5-325 MG per tablet Take 1 tablet by mouth every 4 (four) hours as needed for moderate pain. 11/02/14   Beers, Pierce Crane, PA-C  ibuprofen (ADVIL,MOTRIN) 800 MG tablet Take 1 tablet (800 mg total) by mouth every 8 (eight) hours as needed. 11/02/14   Beers, Pierce Crane, PA-C  loperamide (IMODIUM A-D) 2 MG tablet Take 1 tablet (2 mg total) by mouth 4 (four) times daily as needed for diarrhea or loose stools. 08/10/15   Cuthriell, Charline Bills, PA-C  naproxen (NAPROSYN) 500 MG tablet Take 1 tablet (500 mg total) by mouth 2 (two) times daily with a meal. 02/02/17   Triplett, Cari B, FNP  ondansetron (ZOFRAN-ODT) 4 MG disintegrating tablet Take 1 tablet (4 mg total) by mouth  every 8 (eight) hours as needed for nausea or vomiting. 08/10/15   Cuthriell, Charline Bills, PA-C  sulfacetamide (BLEPH-10) 10 % ophthalmic solution Place 2 drops into both eyes 4 (four) times daily. 11/02/14   Beers, Pierce Crane, PA-C  sulfamethoxazole-trimethoprim (BACTRIM DS) 800-160 MG tablet Take 1 tablet by mouth 2 (two) times daily for 7 days. 04/06/19 04/13/19  Lannie Fields, PA-C    Allergies Patient has no known allergies.  History reviewed. No pertinent family history.  Social History Social History   Tobacco Use  . Smoking status: Current Every Day Smoker    Packs/day: 0.50    Types: Cigarettes  . Smokeless tobacco: Never Used  Substance Use Topics  . Alcohol use: Yes    Comment: occas.   . Drug use: No     Review of Systems  Constitutional: No fever/chills Eyes: No visual changes. No discharge ENT: No upper respiratory complaints. Cardiovascular: no chest pain. Respiratory: no cough. No SOB. Gastrointestinal: No abdominal pain.  No nausea, no vomiting.  No diarrhea.  No constipation. Genitourinary: Negative for dysuria. No hematuria Musculoskeletal: Negative for musculoskeletal pain. Skin: Patient has left sided gluteal cellulitis.  Neurological: Negative for headaches, focal weakness or numbness.  ____________________________________________   PHYSICAL EXAM:  VITAL SIGNS: ED Triage Vitals  Enc Vitals Group     BP 04/06/19 1334 123/61     Pulse Rate 04/06/19 1334 81     Resp 04/06/19 1334 16     Temp 04/06/19 1334 98.7 F (37.1 C)  Temp Source 04/06/19 1334 Oral     SpO2 04/06/19 1334 99 %     Weight --      Height --      Head Circumference --      Peak Flow --      Pain Score 04/06/19 1336 10     Pain Loc --      Pain Edu? --      Excl. in GC? --      Constitutional: Alert and oriented. Well appearing and in no acute distress. Eyes: Conjunctivae are normal. PERRL. EOMI. Head: Atraumatic. Cardiovascular: Normal rate, regular rhythm. Normal  S1 and S2.  Good peripheral circulation. Respiratory: Normal respiratory effort without tachypnea or retractions. Lungs CTAB. Good air entry to the bases with no decreased or absent breath sounds. Gastrointestinal: Bowel sounds 4 quadrants. Soft and nontender to palpation. No guarding or rigidity. No palpable masses. No distention. No CVA tenderness. Musculoskeletal: Full range of motion to all extremities. No gross deformities appreciated. Neurologic:  Normal speech and language. No gross focal neurologic deficits are appreciated.  Skin: Patient has 2 cm x 2 cm region of left gluteal cellulitis.  No appreciable fluctuance or induration to palpation. Psychiatric: Mood and affect are normal. Speech and behavior are normal. Patient exhibits appropriate insight and judgement.   ____________________________________________   LABS (all labs ordered are listed, but only abnormal results are displayed)  Labs Reviewed - No data to display ____________________________________________  EKG   ____________________________________________  RADIOLOGY   No results found.  ____________________________________________    PROCEDURES  Procedure(s) performed:    Procedures    Medications - No data to display   ____________________________________________   INITIAL IMPRESSION / ASSESSMENT AND PLAN / ED COURSE  Pertinent labs & imaging results that were available during my care of the patient were reviewed by me and considered in my medical decision making (see chart for details).  Review of the [place] CSRS was performed in accordance of the [facility] prior to dispensing any controlled drugs.         Assessment and Plan:  Gluteal cellulitis 33 year old female presents to the emergency department with left-sided gluteal cellulitis that has been present for the past 2 to 3 days.  On physical exam, patient has no induration or palpable fluctuance.  She was afebrile at triage and vital  signs remain reassuring throughout emergency department course.  I recommended incision and drainage as patient likely has early abscess formation and patient declined at this time.  She has brought both of her sons with her and would like to try conservative management at home and will return to the emergency department if her symptoms worsen.  She was treated with Bactrim.  I advised using warm compresses on the affected area and gentle exfoliation.  She voiced understanding and has easy access to the ED should symptoms worsen.    ____________________________________________  FINAL CLINICAL IMPRESSION(S) / ED DIAGNOSES  Final diagnoses:  Cellulitis of gluteal region      NEW MEDICATIONS STARTED DURING THIS VISIT:  ED Discharge Orders         Ordered    sulfamethoxazole-trimethoprim (BACTRIM DS) 800-160 MG tablet  2 times daily     04/06/19 1529              This chart was dictated using voice recognition software/Dragon. Despite best efforts to proofread, errors can occur which can change the meaning. Any change was purely unintentional.    Orvil Feil, PA-C  04/06/19 1550    Emily FilbertWilliams, Jonathan E, MD 04/06/19 [phone]

## 2019-04-06 NOTE — ED Notes (Signed)
Pt asked this nurse if she had been forgotten. RN explained that the providers are making their rounds and will get to her as soon as they can.

## 2019-05-26 ENCOUNTER — Other Ambulatory Visit: Payer: Self-pay

## 2019-05-26 DIAGNOSIS — Z20822 Contact with and (suspected) exposure to covid-19: Secondary | ICD-10-CM

## 2019-05-27 ENCOUNTER — Telehealth: Payer: Self-pay

## 2019-05-27 LAB — NOVEL CORONAVIRUS, NAA: SARS-CoV-2, NAA: NOT DETECTED

## 2019-05-27 NOTE — Telephone Encounter (Signed)
Received call from patient checking Covid results.  Advised no results at this time.   

## 2021-02-28 ENCOUNTER — Encounter: Payer: Self-pay | Admitting: *Deleted

## 2021-02-28 ENCOUNTER — Other Ambulatory Visit: Payer: Self-pay

## 2021-02-28 ENCOUNTER — Emergency Department
Admission: EM | Admit: 2021-02-28 | Discharge: 2021-02-28 | Disposition: A | Discharge status: Home / Self Care | Payer: 59 | Attending: Emergency Medicine | Admitting: Emergency Medicine

## 2021-02-28 DIAGNOSIS — R1084 Generalized abdominal pain: Secondary | ICD-10-CM | POA: Diagnosis not present

## 2021-02-28 DIAGNOSIS — G8929 Other chronic pain: Secondary | ICD-10-CM

## 2021-02-28 DIAGNOSIS — F17210 Nicotine dependence, cigarettes, uncomplicated: Secondary | ICD-10-CM | POA: Diagnosis not present

## 2021-02-28 LAB — CBC
HCT: 37.3 % (ref 36.0–46.0)
Hemoglobin: 12.1 g/dL (ref 12.0–15.0)
MCH: 28.3 pg (ref 26.0–34.0)
MCHC: 32.4 g/dL (ref 30.0–36.0)
MCV: 87.4 fL (ref 80.0–100.0)
Platelets: 289 10*3/uL (ref 150–400)
RBC: 4.27 MIL/uL (ref 3.87–5.11)
RDW: 13.7 % (ref 11.5–15.5)
WBC: 9.8 10*3/uL (ref 4.0–10.5)
nRBC: 0 % (ref 0.0–0.2)

## 2021-02-28 LAB — POC URINE PREG, ED: Preg Test, Ur: NEGATIVE

## 2021-02-28 LAB — COMPREHENSIVE METABOLIC PANEL
ALT: 14 U/L (ref 0–44)
AST: 17 U/L (ref 15–41)
Albumin: 3.8 g/dL (ref 3.5–5.0)
Alkaline Phosphatase: 46 U/L (ref 38–126)
Anion gap: 8 (ref 5–15)
BUN: 9 mg/dL (ref 6–20)
CO2: 25 mmol/L (ref 22–32)
Calcium: 8.5 mg/dL — ABNORMAL LOW (ref 8.9–10.3)
Chloride: 105 mmol/L (ref 98–111)
Creatinine, Ser: 0.75 mg/dL (ref 0.44–1.00)
GFR, Estimated: >60 mL/min (ref >60)
Glucose, Bld: 89 mg/dL (ref 70–99)
Potassium: 3.5 mmol/L (ref 3.5–5.1)
Sodium: 138 mmol/L (ref 135–145)
Total Bilirubin: 0.6 mg/dL (ref 0.3–1.2)
Total Protein: 7.9 g/dL (ref 6.5–8.1)

## 2021-02-28 LAB — URINALYSIS, COMPLETE (UACMP) WITH MICROSCOPIC
Bilirubin Urine: NEGATIVE
Glucose, UA: NEGATIVE mg/dL
Ketones, ur: NEGATIVE mg/dL
Nitrite: NEGATIVE
Protein, ur: NEGATIVE mg/dL
Specific Gravity, Urine: 1.015 (ref 1.005–1.030)
pH: 6.5 (ref 5.0–8.0)

## 2021-02-28 LAB — LIPASE, BLOOD: Lipase: 26 U/L (ref 11–51)

## 2021-02-28 MED ORDER — FAMOTIDINE 20 MG PO TABS: 20.0000 mg | ORAL_TABLET | Freq: Two times a day (BID) | ORAL | 0 refills | Status: DC

## 2021-02-28 NOTE — ED Provider Notes (Signed)
[facility]  ____________________________________________   Event Date/Time   First MD Initiated Contact with Patient 02/28/21 1807     (approximate)  I have reviewed the triage vital signs and the nursing notes.   HISTORY  Chief Complaint Abdominal Pain    HPI Maria Gillespie is a 35 y.o. female with no significant past medical history who presents with abdominal pain.  Pain has been going on for the last 9 months.  It is located bilaterally in the center of the abdomen.  She says that it is a constant pain.  It is not associated with nausea, vomiting, diarrhea or constipation.  She is able to tolerate p.o.  Denies any weight loss or blood in her stool.  She has seen her primary care provider twice for this and has been prescribed medications which she does not think are helping.  She denies urinary symptoms no fevers or chills.         No past medical history on file.  There are no problems to display for this patient.   No past surgical history on file.  Prior to Admission medications   Medication Sig Start Date End Date Taking? Authorizing Provider  famotidine (PEPCID) 20 MG tablet Take 1 tablet (20 mg total) by mouth 2 (two) times daily. 02/28/21 03/30/21 Yes Georga Hacking, MD  amoxicillin (AMOXIL) 500 MG tablet Take 1 tablet (500 mg total) by mouth 3 (three) times daily. 11/02/14   Beers, Charmayne Sheer, PA-C  HYDROcodone-acetaminophen (NORCO) 5-325 MG per tablet Take 1 tablet by mouth every 4 (four) hours as needed for moderate pain. 11/02/14   Beers, Charmayne Sheer, PA-C  ibuprofen (ADVIL,MOTRIN) 800 MG tablet Take 1 tablet (800 mg total) by mouth every 8 (eight) hours as needed. 11/02/14   Beers, Charmayne Sheer, PA-C  loperamide (IMODIUM A-D) 2 MG tablet Take 1 tablet (2 mg total) by mouth 4 (four) times daily as needed for diarrhea or loose stools. 08/10/15   Cuthriell, Delorise Royals, PA-C  naproxen (NAPROSYN) 500 MG tablet Take 1 tablet (500 mg total) by mouth 2  (two) times daily with a meal. 02/02/17   Triplett, Cari B, FNP  ondansetron (ZOFRAN-ODT) 4 MG disintegrating tablet Take 1 tablet (4 mg total) by mouth every 8 (eight) hours as needed for nausea or vomiting. 08/10/15   Cuthriell, Delorise Royals, PA-C  sulfacetamide (BLEPH-10) 10 % ophthalmic solution Place 2 drops into both eyes 4 (four) times daily. 11/02/14   Beers, Charmayne Sheer, PA-C    Allergies Patient has no known allergies.  No family history on file.  Social History Social History   Tobacco Use   Smoking status: Every Day    Packs/day: 0.50    Types: Cigarettes   Smokeless tobacco: Never  Substance Use Topics   Alcohol use: Yes    Comment: occas.    Drug use: No    Review of Systems   Review of Systems  Constitutional:  Negative for appetite change, chills, fever and unexpected weight change.  Gastrointestinal:  Positive for abdominal pain. Negative for abdominal distention, constipation, diarrhea and vomiting.  Genitourinary:  Negative for dysuria, vaginal bleeding and vaginal discharge.   Physical Exam Updated Vital Signs BP 114/72 (BP Location: Left Arm)   Pulse (!) 59   Temp 98.5 F (36.9 C) (Oral)   Resp 20   Ht 5\' 11"  (1.803 m)   Wt 95.7 kg   LMP 01/26/2021 (Approximate)   SpO2 99%   BMI 29.43  kg/m   Physical Exam Vitals and nursing note reviewed.  Constitutional:      General: She is not in acute distress.    Appearance: Normal appearance.  HENT:     Head: Normocephalic and atraumatic.  Eyes:     General: No scleral icterus.    Conjunctiva/sclera: Conjunctivae normal.  Pulmonary:     Effort: Pulmonary effort is normal. No respiratory distress.     Breath sounds: No stridor.  Abdominal:     General: Abdomen is flat.     Tenderness: There is no abdominal tenderness.     Comments: Abdomen is soft, nontender throughout  Musculoskeletal:        General: No deformity or signs of injury.     Cervical back: Normal range of motion.  Skin:    General: Skin  is dry.     Coloration: Skin is not jaundiced or pale.  Neurological:     General: No focal deficit present.     Mental Status: She is alert and oriented to person, place, and time. Mental status is at baseline.  Psychiatric:        Mood and Affect: Mood normal.        Behavior: Behavior normal.     LABS (all labs ordered are listed, but only abnormal results are displayed)  Labs Reviewed  COMPREHENSIVE METABOLIC PANEL - Abnormal; Notable for the following components:      Result Value   Calcium 8.5 (*)    All other components within normal limits  URINALYSIS, COMPLETE (UACMP) WITH MICROSCOPIC - Abnormal; Notable for the following components:   Hgb urine dipstick TRACE (*)    Leukocytes,Ua SMALL (*)    Bacteria, UA RARE (*)    All other components within normal limits  LIPASE, BLOOD  CBC  POC URINE PREG, ED   ____________________________________________  EKG  N/a ____________________________________________  RADIOLOGY Ky Barban, personally viewed and evaluated these images (plain radiographs) as part of my medical decision making, as well as reviewing the written report by the radiologist.  ED MD interpretation:  n/a    ____________________________________________   PROCEDURES  Procedure(s) performed (including Critical Care):  Procedures   ____________________________________________   INITIAL IMPRESSION / ASSESSMENT AND PLAN / ED COURSE     35 year old female presents for evaluation of chronic abdominal pain.  She has minimal associated symptoms, no red flag symptoms including weight loss, blood in her stool fevers or chills.  Labs obtained from triage are reassuring, normal CBC, CMP and lipase.  No evidence of UTI and she is hCG negative.  On evaluation patient is very well-appearing.  Her abdomen is soft and nontender.  Suspect IBS.  Will prescribe a course of Pepcid to trial.  I will refer the patient to GI as she may need to have colonoscopy  endoscopy versus supportive care for IBS.  Very low suspicion for surgical pathology given the timeframe and her benign abdomen.  I discussed this with the patient and she was agreeable with the plan.      ____________________________________________   FINAL CLINICAL IMPRESSION(S) / ED DIAGNOSES  Final diagnoses:  Chronic abdominal pain     ED Discharge Orders          Ordered    famotidine (PEPCID) 20 MG tablet  2 times daily        02/28/21 1837             Note:  This document was prepared using Dragon voice recognition  software and may include unintentional dictation errors.    Georga Hacking, MD 02/28/21 [phone]

## 2021-02-28 NOTE — ED Triage Notes (Signed)
Pt has abd pain for 9 months with nausea.  Pt states her lower back hurts too. Pt taking meds for pain without relief. No vag bleeding  denies urinary sx.  Pt alert speech clear.

## 2021-02-28 NOTE — ED Notes (Signed)
Poct pregnancy Negative 

## 2021-02-28 NOTE — Discharge Instructions (Signed)
You can start taking the Pepcid to see if this helps with your pain.  Please follow-up with both your primary care provider and the gastroenterologist.  You may need an endoscopy to further evaluate the cause of your pain.  Your blood work and urine sample was reassuring today.

## 2022-11-25 ENCOUNTER — Emergency Department: Payer: Self-pay

## 2022-11-25 ENCOUNTER — Other Ambulatory Visit: Payer: Self-pay

## 2022-11-25 ENCOUNTER — Emergency Department
Admission: EM | Admit: 2022-11-25 | Discharge: 2022-11-25 | Disposition: A | Discharge status: Home / Self Care | Payer: Self-pay | Attending: Emergency Medicine | Admitting: Emergency Medicine

## 2022-11-25 DIAGNOSIS — R1013 Epigastric pain: Secondary | ICD-10-CM

## 2022-11-25 DIAGNOSIS — K8020 Calculus of gallbladder without cholecystitis without obstruction: Secondary | ICD-10-CM | POA: Insufficient documentation

## 2022-11-25 DIAGNOSIS — R197 Diarrhea, unspecified: Secondary | ICD-10-CM | POA: Insufficient documentation

## 2022-11-25 LAB — URINALYSIS, ROUTINE W REFLEX MICROSCOPIC
Bacteria, UA: NONE SEEN
Bilirubin Urine: NEGATIVE
Glucose, UA: NEGATIVE mg/dL
Ketones, ur: 20 mg/dL — AB
Leukocytes,Ua: NEGATIVE
Nitrite: NEGATIVE
Protein, ur: NEGATIVE mg/dL
Specific Gravity, Urine: 1.013 (ref 1.005–1.030)
pH: 8 (ref 5.0–8.0)

## 2022-11-25 LAB — CBC
HCT: 36.8 % (ref 36.0–46.0)
Hemoglobin: 11.8 g/dL — ABNORMAL LOW (ref 12.0–15.0)
MCH: 25.8 pg — ABNORMAL LOW (ref 26.0–34.0)
MCHC: 32.1 g/dL (ref 30.0–36.0)
MCV: 80.3 fL (ref 80.0–100.0)
Platelets: 356 10*3/uL (ref 150–400)
RBC: 4.58 MIL/uL (ref 3.87–5.11)
RDW: 15.7 % — ABNORMAL HIGH (ref 11.5–15.5)
WBC: 13.1 10*3/uL — ABNORMAL HIGH (ref 4.0–10.5)
nRBC: 0 % (ref 0.0–0.2)

## 2022-11-25 LAB — COMPREHENSIVE METABOLIC PANEL
ALT: 15 U/L (ref 0–44)
AST: 25 U/L (ref 15–41)
Albumin: 4.3 g/dL (ref 3.5–5.0)
Alkaline Phosphatase: 52 U/L (ref 38–126)
Anion gap: 14 (ref 5–15)
BUN: 5 mg/dL — ABNORMAL LOW (ref 6–20)
CO2: 19 mmol/L — ABNORMAL LOW (ref 22–32)
Calcium: 9.5 mg/dL (ref 8.9–10.3)
Chloride: 100 mmol/L (ref 98–111)
Creatinine, Ser: 0.68 mg/dL (ref 0.44–1.00)
GFR, Estimated: >60 mL/min (ref >60)
Glucose, Bld: 123 mg/dL — ABNORMAL HIGH (ref 70–99)
Potassium: 3.9 mmol/L (ref 3.5–5.1)
Sodium: 133 mmol/L — ABNORMAL LOW (ref 135–145)
Total Bilirubin: 0.7 mg/dL (ref 0.3–1.2)
Total Protein: 9.2 g/dL — ABNORMAL HIGH (ref 6.5–8.1)

## 2022-11-25 LAB — URINE DRUG SCREEN, QUALITATIVE (ARMC ONLY)
Amphetamines, Ur Screen: NOT DETECTED
Barbiturates, Ur Screen: NOT DETECTED
Benzodiazepine, Ur Scrn: NOT DETECTED
Cannabinoid 50 Ng, Ur ~~LOC~~: NOT DETECTED
Cocaine Metabolite,Ur ~~LOC~~: NOT DETECTED
MDMA (Ecstasy)Ur Screen: NOT DETECTED
Methadone Scn, Ur: NOT DETECTED
Opiate, Ur Screen: NOT DETECTED
Phencyclidine (PCP) Ur S: NOT DETECTED
Tricyclic, Ur Screen: NOT DETECTED

## 2022-11-25 LAB — POC URINE PREG, ED: Preg Test, Ur: NEGATIVE

## 2022-11-25 LAB — LIPASE, BLOOD: Lipase: 37 U/L (ref 11–51)

## 2022-11-25 LAB — MAGNESIUM: Magnesium: 2 mg/dL (ref 1.7–2.4)

## 2022-11-25 MED ORDER — ONDANSETRON HCL 4 MG/2ML IJ SOLN
INTRAMUSCULAR | Status: AC
  Administered 2022-11-25: 4 mg via INTRAVENOUS
  Filled 2022-11-25: qty 2

## 2022-11-25 MED ORDER — FAMOTIDINE IN NACL 20-0.9 MG/50ML-% IV SOLN
20.0000 mg | Freq: Once | INTRAVENOUS | Status: AC
  Administered 2022-11-25: 20 mg via INTRAVENOUS
  Filled 2022-11-25: qty 50

## 2022-11-25 MED ORDER — SODIUM CHLORIDE 0.9 % IV BOLUS
1000.0000 mL | Freq: Once | INTRAVENOUS | Status: AC
  Administered 2022-11-25: 1000 mL via INTRAVENOUS

## 2022-11-25 MED ORDER — FENTANYL CITRATE PF 50 MCG/ML IJ SOSY
50.0000 ug | PREFILLED_SYRINGE | Freq: Once | INTRAMUSCULAR | Status: AC
  Administered 2022-11-25: 50 ug via INTRAVENOUS
  Filled 2022-11-25: qty 1

## 2022-11-25 MED ORDER — MAGNESIUM SULFATE 2 GM/50ML IV SOLN
2.0000 g | Freq: Once | INTRAVENOUS | Status: AC
  Administered 2022-11-25: 2 g via INTRAVENOUS
  Filled 2022-11-25: qty 50

## 2022-11-25 MED ORDER — ONDANSETRON HCL 4 MG/2ML IJ SOLN: 4.0000 mg | Freq: Once | INTRAMUSCULAR | Status: AC

## 2022-11-25 MED ORDER — OXYCODONE-ACETAMINOPHEN 5-325 MG PO TABS: 1.0000 | ORAL_TABLET | ORAL | 0 refills | Status: AC | PRN

## 2022-11-25 MED ORDER — IOHEXOL 300 MG/ML  SOLN
100.0000 mL | Freq: Once | INTRAMUSCULAR | Status: AC | PRN
  Administered 2022-11-25: 100 mL via INTRAVENOUS

## 2022-11-25 NOTE — ED Notes (Signed)
Pt. Returned from CT.

## 2022-11-25 NOTE — ED Notes (Signed)
Pt. Provided saltines and ice water for PO challenge.

## 2022-11-25 NOTE — ED Triage Notes (Signed)
Patient states vomiting and generalized abdominal pain that started last night.

## 2022-11-25 NOTE — Discharge Instructions (Addendum)
You were seen in the emerged department for upper abdominal pain.  You had an ultrasound done that showed signs of gallstones.  You did not have findings of an infected gallbladder.  Your lab work otherwise looked normal.  It is importantly follow-up as an outpatient with general surgery as you may need to have your gallbladder removed if it continues to cause you problems.  Change your diet as discussed.   You were given a prescription for narcotic pain medications.  Take only if in severe pain.  These are very addictive medications.  These medications can make you constipated.  If you need to take more than 1-2 doses, start a stool softner.  If you become constipated, take 1 capfull of MiraLAX, can repeat untill having regular bowel movements.  Keep this medication out of reach of any children.  You were found to have uterine fibroids incidentally on your CT scan.  You can follow-up with your gynecologist about these findings.

## 2022-11-25 NOTE — ED Provider Notes (Signed)
[facility] Provider Note    Event Date/Time   First MD Initiated Contact with Patient 11/25/22 1008     (approximate)   History   Emesis   HPI  Maria Gillespie is a 37 y.o. female presents to the emergency department with abdominal pain.  Endorses significant abdominal pain that started last night.  Home pregnancy test was negative.  Severe abdominal pain associated with diarrhea and vomiting.  Epigastric abdominal pain.  No prior abdominal surgeries.  Denies any significant alcohol use or marijuana use.  Endorses Motrin use, denies any Goody powder use.  No prior abdominal surgeries.  Denies any dysuria, urinary urgency or frequency.     Physical Exam   Triage Vital Signs: ED Triage Vitals  Enc Vitals Group     BP 11/25/22 0859 91/71     Pulse Rate 11/25/22 0859 74     Resp 11/25/22 0859 (!) 22     Temp 11/25/22 0859 97.8 F (36.6 C)     Temp Source 11/25/22 0859 Oral     SpO2 11/25/22 0859 100 %     Weight --      Height --      Head Circumference --      Peak Flow --      Pain Score 11/25/22 0858 10     Pain Loc --      Pain Edu? --      Excl. in GC? --     Most recent vital signs: Vitals:   11/25/22 1400 11/25/22 1430  BP: 116/76 115/71  Pulse: (!) 50 61  Resp: 17 13  Temp:    SpO2: 100% 100%    Physical Exam Constitutional:      General: She is in acute distress.     Appearance: She is well-developed.  HENT:     Head: Atraumatic.  Eyes:     Conjunctiva/sclera: Conjunctivae normal.  Cardiovascular:     Rate and Rhythm: Regular rhythm.  Pulmonary:     Effort: No respiratory distress.  Abdominal:     General: There is no distension.     Tenderness: There is abdominal tenderness (Diffuse, worse in epigastric).  Musculoskeletal:        General: Normal range of motion.     Cervical back: Normal range of motion.  Skin:    General: Skin is warm.     Capillary Refill: Capillary refill takes less than 2 seconds.  Neurological:      Mental Status: She is alert. Mental status is at baseline.     IMPRESSION / MDM / ASSESSMENT AND PLAN / ED COURSE  I reviewed the triage vital signs and the nursing notes.  Differential diagnosis including gastritis/PUD, pancreatitis, acute cholecystitis, kidney stones, pyelonephritis, viral gastroenteritis, cyclical vomiting syndrome, pregnancy/ectopic pregnancy  EKG  I, Corena Herter, the attending physician, personally viewed and interpreted this ECG.   Rate: Normal  Rhythm: Normal sinus  Axis: Normal  Intervals: Prolonged QTc interval 588  ST&T Change: None  No tachycardic or bradycardic dysrhythmias while on cardiac telemetry.  RADIOLOGY I independently reviewed imaging, my interpretation of imaging: CT scan without findings concerning for small bowel obstruction or acute appendicitis.  Gallbladder wall thickening and enlarged uterus.  Read as gallbladder wall thickening and correlate to ultrasound and fibroids in the uterus.  LABS (all labs ordered are listed, but only abnormal results are displayed) Labs interpreted as -    Labs Reviewed  COMPREHENSIVE METABOLIC PANEL - Abnormal; Notable  for the following components:      Result Value   Sodium 133 (*)    CO2 19 (*)    Glucose, Bld 123 (*)    BUN 5 (*)    Total Protein 9.2 (*)    All other components within normal limits  CBC - Abnormal; Notable for the following components:   WBC 13.1 (*)    Hemoglobin 11.8 (*)    MCH 25.8 (*)    RDW 15.7 (*)    All other components within normal limits  URINALYSIS, ROUTINE W REFLEX MICROSCOPIC - Abnormal; Notable for the following components:   Color, Urine YELLOW (*)    APPearance CLEAR (*)    Hgb urine dipstick SMALL (*)    Ketones, ur 20 (*)    All other components within normal limits  LIPASE, BLOOD  URINE DRUG SCREEN, QUALITATIVE ([facility] ONLY)  MAGNESIUM  POC URINE PREG, ED     MDM  Treated with IV fluids, IV antiemetics, Pepcid and pain medication.  Given 2  g of IV magnesium given prolonged QTc.    CT scan with concern for thickened gallbladder.  Gallbladder ultrasound with cholelithiasis but no signs of acute cholecystitis.  Concern for possible chronic findings.  Patient able to tolerate p.o.  Pain has resolved and she is feeling much better.  Have a low suspicion for acute cholecystitis.  Repeat EKG was obtained and no longer has a prolonged QTc.  Discussed gallbladder diet.  Given a prescription for pain medication.  Given information to follow-up with general surgery as an outpatient for possible HIDA scan and further workup of her gallbladder.  Given return precautions for any fever or worsening symptoms.   PROCEDURES:  Critical Care performed: No  Procedures  Patient's presentation is most consistent with acute presentation with potential threat to life or bodily function.   MEDICATIONS ORDERED IN ED: Medications  sodium chloride 0.9 % bolus 1,000 mL (0 mLs Intravenous Stopped 11/25/22 1441)  ondansetron (ZOFRAN) injection 4 mg (4 mg Intravenous Given 11/25/22 1043)  famotidine (PEPCID) IVPB 20 mg premix (0 mg Intravenous Stopped 11/25/22 1145)  fentaNYL (SUBLIMAZE) injection 50 mcg (50 mcg Intravenous Given 11/25/22 1114)  sodium chloride 0.9 % bolus 1,000 mL (0 mLs Intravenous Stopped 11/25/22 1441)  iohexol (OMNIPAQUE) 300 MG/ML solution 100 mL (100 mLs Intravenous Contrast Given 11/25/22 1129)  magnesium sulfate IVPB 2 g 50 mL (0 g Intravenous Stopped 11/25/22 1402)  fentaNYL (SUBLIMAZE) injection 50 mcg (50 mcg Intravenous Given 11/25/22 1300)    FINAL CLINICAL IMPRESSION(S) / ED DIAGNOSES   Final diagnoses:  Gallstones  Epigastric pain     Rx / DC Orders   ED Discharge Orders          Ordered    oxyCODONE-acetaminophen (PERCOCET) 5-325 MG tablet  Every 4 hours PRN        11/25/22 1517             Note:  This document was prepared using Dragon voice recognition software and may include unintentional dictation  errors.   Corena Herter, MD 11/25/22 [phone]

## 2023-07-09 ENCOUNTER — Other Ambulatory Visit: Payer: Self-pay

## 2023-07-09 ENCOUNTER — Encounter: Payer: Self-pay | Admitting: Emergency Medicine

## 2023-07-09 ENCOUNTER — Emergency Department
Admission: EM | Admit: 2023-07-09 | Discharge: 2023-07-09 | Disposition: A | Discharge status: Home / Self Care | Payer: Self-pay | Attending: Emergency Medicine | Admitting: Emergency Medicine

## 2023-07-09 DIAGNOSIS — K0889 Other specified disorders of teeth and supporting structures: Secondary | ICD-10-CM | POA: Insufficient documentation

## 2023-07-09 MED ORDER — AMOXICILLIN 875 MG PO TABS: 875.0000 mg | ORAL_TABLET | Freq: Two times a day (BID) | ORAL | 0 refills | Status: DC

## 2023-07-09 NOTE — ED Triage Notes (Signed)
Pt to ED via POV. Pt states that she has been having dental pain on he right side of her mouth for the last week. Pt states that she does not have dentist that she sees. Pt is in NAD

## 2023-07-09 NOTE — Discharge Instructions (Addendum)
OPTIONS FOR DENTAL FOLLOW UP CARE  [place] [facility] - Local Safety Net Dental Clinics [email or web address]   [facility] [phone])  [facility] [phone])  [place] [phone])  [facility] [phone])  [facility] [phone]) This clinic caters to the indigent population and is on a lottery system. Location: [employer] [facility], [employer], [address] [place] Clinic Hours: Wednesdays from 6pm - 9pm, patients seen by a lottery system. For dates, call or go to [email or web address] Services: Cleanings, fillings and simple extractions. Payment Options: DENTAL WORK IS FREE OF CHARGE. Bring proof of income or support. Best way to get seen: Arrive at 5:15 pm - this is a lottery, NOT first come/first serve, so arriving earlier will not increase your chances of being seen.     [facility] [phone] Select option 1 for emergencies   Location: [facility], [place], [address] [place] Clinic Hours: No walk-ins accepted - call the day before to schedule an appointment. Check in times are 9:30 am and 1:30 pm. Services: Simple extractions, temporary fillings, pulpectomy/pulp debridement, uncomplicated abscess drainage. Payment Options: PAYMENT IS DUE AT THE TIME OF SERVICE.  Fee is usually $100-200, additional surgical procedures (e.g. abscess drainage) may be extra. Cash, checks, Visa/MasterCard accepted.  Can file Medicaid if patient is covered for dental - patient should call case worker to check. No discount for [facility] patients. Best way to get seen: MUST call the day before and get onto the schedule. Can usually be seen the next 1-2 days. No walk-ins accepted.     [facility] [phone]    Location: [facility], [address], [place] Clinic Hours: M, W, Th, F 8am or 1:30pm, Tues 9a or 1:30 - first come/first served. Services: Simple extractions, temporary fillings, uncomplicated abscess drainage.  You do not need to be an [facility] resident. Payment Options: PAYMENT IS DUE AT THE TIME OF SERVICE. Dental insurance, otherwise sliding scale - bring proof of income or support. Depending on income and treatment needed, cost is usually $50-200. Best way to get seen: Arrive early as it is first come/first served.     [facility] [phone]   Location: [address] Clinic Hours: Mon-Thu 8a-5p Services: Most basic dental services including extractions and fillings. Payment Options: PAYMENT IS DUE AT THE TIME OF SERVICE. Sliding scale, up to 50% off - bring proof if income or support. Medicaid with dental option accepted. Best way to get seen: Call to schedule an appointment, can usually be seen within 2 weeks OR they will try to see walk-ins - show up at 8a or 2p (you may have to wait).     [facility] [phone] [place] RESIDENTS ONLY   Location: [facility], [address], [place], [place] [address] Clinic Hours: By appointment only. Monday - Thursday 8am-5pm, Friday 8am-12pm Services: Cleanings, fillings, extractions. Payment Options: PAYMENT IS DUE AT THE TIME OF SERVICE. Cash, Visa or MasterCard. Sliding scale - $30 minimum per service. Best way to get seen: Come in to office, complete packet and make an appointment - need proof of income or support monies for each household member and proof of [facility] residence. Usually takes about a month to get in.     [facility] [phone]   Location: [address],  [place] Clinic Hours: Walk-in Urgent Care Dental Services are offered Monday-Friday  mornings only. The numbers of emergencies accepted daily is limited to the number of providers available. Maximum 15 - Mondays, Wednesdays & Thursdays Maximum 10 - Tuesdays & Fridays Services: You do not need to be a [facility] resident to be seen for a dental emergency. Emergencies are defined as pain, swelling, abnormal bleeding, or dental trauma. Walkins will receive x-rays if needed. NOTE: Dental cleaning is not an emergency. Payment Options: PAYMENT IS DUE AT THE TIME OF SERVICE. Minimum co-pay is $40.00 for uninsured patients. Minimum co-pay is $3.00 for Medicaid with dental coverage. Dental Insurance is accepted and must be presented at time of visit. Medicare does not cover dental. Forms of payment: Cash, credit card, checks. Best way to get seen: If not previously registered with the clinic, walk-in dental registration begins at 7:15 am and is on a first come/first serve basis. If previously registered with the clinic, call to make an appointment.     [facility] [phone] [place] RESIDENTS ONLY   Location: [address], [place], [place] Clinic Hours: Mon-Thu 10a-2p Services: Extractions only! Payment Options: FREE (donations accepted) - bring proof of income or support Best way to get seen: Call and schedule an appointment OR come at 8am on the 1st Monday of every month (except for holidays) when it is first come/first served.     [facility] [phone]   Location: [address] [place], [place] Clinic Hours: Friday mornings Services, Payment Options, Best way to get seen: Call for info

## 2023-07-09 NOTE — ED Provider Notes (Signed)
[facility] Provider Note    Event Date/Time   First MD Initiated Contact with Patient 07/09/23 1003     (approximate)   History   Dental Pain   HPI  Maria Gillespie is a 38 y.o. female   presents to the ED with complaint of right upper dental pain for the last 4 to 5 days.  Patient reports that she does not have a dentist.  She has been taking over-the-counter medication without complete relief.      Physical Exam   Triage Vital Signs: ED Triage Vitals  Encounter Vitals Group     BP 07/09/23 0852 112/66     Systolic BP Percentile --      Diastolic BP Percentile --      Pulse Rate 07/09/23 0851 65     Resp 07/09/23 0851 16     Temp 07/09/23 0851 98.4 F (36.9 C)     Temp Source 07/09/23 0851 Oral     SpO2 07/09/23 0851 100 %     Weight 07/09/23 0851 180 lb (81.6 kg)     Height 07/09/23 0851 5\' 11"  (1.803 m)     Head Circumference --      Peak Flow --      Pain Score 07/09/23 0851 7     Pain Loc --      Pain Education --      Exclude from Growth Chart --     Most recent vital signs: Vitals:   07/09/23 0851 07/09/23 0852  BP:  112/66  Pulse: 65   Resp: 16   Temp: 98.4 F (36.9 C)   SpO2: 100%      General: Awake, no distress.  CV:  Good peripheral perfusion.  Resp:  Normal effort.  Abd:  No distention.  Other:  Right upper premolar and molar area gums are inflamed and tender.  No obvious abscess is noted.  No dental trauma.   ED Results / Procedures / Treatments   Labs (all labs ordered are listed, but only abnormal results are displayed) Labs Reviewed - No data to display    PROCEDURES:  Critical Care performed:   Procedures   MEDICATIONS ORDERED IN ED: Medications - No data to display   IMPRESSION / MDM / ASSESSMENT AND PLAN / ED COURSE  I reviewed the triage vital signs and the nursing notes.   Differential diagnosis includes, but is not limited to, dental pain, dental carry, dental abscess,  gingivitis.  38 year old female presents to the ED with complaint of dental pain for the last 3 to 4 days.  Patient states that she has been taking over-the-counter medication with minimal relief.  A list of dental clinics was given to her along with a prescription for amoxicillin 875 twice daily for 10 days was sent to the pharmacy.  She is to continue with Tylenol and ibuprofen as needed.      Patient's presentation is most consistent with acute, uncomplicated illness.  FINAL CLINICAL IMPRESSION(S) / ED DIAGNOSES   Final diagnoses:  Toothache     Rx / DC Orders   ED Discharge Orders          Ordered    amoxicillin (AMOXIL) 875 MG tablet  2 times daily        07/09/23 1106             Note:  This document was prepared using Dragon voice recognition software and may include unintentional dictation errors.   Levada Schilling,  Kallie Locks, PA-C 07/09/23 1143    Phineas Semen, MD 07/09/23 1329

## 2023-07-16 ENCOUNTER — Other Ambulatory Visit: Payer: Self-pay

## 2023-07-16 DIAGNOSIS — Z20822 Contact with and (suspected) exposure to covid-19: Secondary | ICD-10-CM | POA: Insufficient documentation

## 2023-07-16 DIAGNOSIS — J028 Acute pharyngitis due to other specified organisms: Secondary | ICD-10-CM | POA: Insufficient documentation

## 2023-07-16 DIAGNOSIS — B9789 Other viral agents as the cause of diseases classified elsewhere: Secondary | ICD-10-CM | POA: Insufficient documentation

## 2023-07-16 LAB — RESP PANEL BY RT-PCR (RSV, FLU A&B, COVID)  RVPGX2
Influenza A by PCR: NEGATIVE
Influenza B by PCR: NEGATIVE
Resp Syncytial Virus by PCR: NEGATIVE
SARS Coronavirus 2 by RT PCR: NEGATIVE

## 2023-07-16 LAB — GROUP A STREP BY PCR: Group A Strep by PCR: NOT DETECTED

## 2023-07-16 NOTE — ED Provider Triage Note (Signed)
Emergency Medicine Provider Triage Evaluation Note  Maria Gillespie , a 38 y.o. female  was evaluated in triage.  Pt complains of history of cough, sore throat, fever, dysphonia.  States son is sick with viral infection  Review of Systems  Positive:  Negative:   Physical Exam  Ht 5\' 11"  (1.803 m)   Wt 77.1 kg   LMP 06/25/2023   BMI 23.71 kg/m  Gen:   Awake, no distress   Resp:  Normal effort  MSK:   Moves extremities without difficulty  Other:    Medical Decision Making  Medically screening exam initiated at 9:01 PM.  Appropriate orders placed.  [name] was informed that the remainder of the evaluation will be completed by another provider, this initial triage assessment does not replace that evaluation, and the importance of remaining in the ED until their evaluation is complete.  Patient with upper respiratory infection ordered respiratory panel and strep   Gladys Damme, PA-C 07/16/23 2102

## 2023-07-16 NOTE — ED Triage Notes (Signed)
Pt reports sore throat that began earlier today, pt denies cough congestion or fever

## 2023-07-17 ENCOUNTER — Emergency Department
Admission: EM | Admit: 2023-07-17 | Discharge: 2023-07-17 | Disposition: A | Discharge status: Home / Self Care | Payer: Self-pay | Attending: Emergency Medicine | Admitting: Emergency Medicine

## 2023-07-17 DIAGNOSIS — J029 Acute pharyngitis, unspecified: Secondary | ICD-10-CM

## 2023-07-17 MED ORDER — MAGIC MOUTHWASH: ORAL | 0 refills | Status: DC

## 2023-07-17 MED ORDER — DEXAMETHASONE 4 MG PO TABS
10.0000 mg | ORAL_TABLET | Freq: Once | ORAL | Status: AC
  Administered 2023-07-17: 10 mg via ORAL
  Filled 2023-07-17: qty 3

## 2023-07-17 MED ORDER — MAGIC MOUTHWASH
10.0000 mL | Freq: Once | ORAL | Status: AC
  Administered 2023-07-17: 10 mL via ORAL
  Filled 2023-07-17: qty 10

## 2023-07-17 NOTE — ED Provider Notes (Signed)
 [facility] Provider Note    Event Date/Time   First MD Initiated Contact with Patient 07/17/23 0105     (approximate)   History   Sore Throat   HPI  Maria Gillespie is a 38 y.o. female who presents to the ED from home with a 1 day history of sore throat, chills, dry cough and congestion.  Denies chest pain, shortness of breath, abdominal pain, nausea/vomiting/diarrhea.     Past Medical History  History reviewed. No pertinent past medical history.   Active Problem List  There are no active problems to display for this patient.    Past Surgical History  History reviewed. No pertinent surgical history.   Home Medications   Prior to Admission medications   Medication Sig Start Date End Date Taking? Authorizing Provider  magic mouthwash SOLN 12mL Anbesol 30mL Benadryl  30mL Mylanta  5mL swish, gargle & spit q8hr prn throat discomfort 07/17/23  Yes Neiva Maenza J, MD  amoxicillin  (AMOXIL ) 875 MG tablet Take 1 tablet (875 mg total) by mouth 2 (two) times daily. 07/09/23   Saunders Shona CROME, PA-C  famotidine  (PEPCID ) 20 MG tablet Take 1 tablet (20 mg total) by mouth 2 (two) times daily. 02/28/21 03/30/21  Clide Burnard Ee, MD  loperamide  (IMODIUM  A-D) 2 MG tablet Take 1 tablet (2 mg total) by mouth 4 (four) times daily as needed for diarrhea or loose stools. 08/10/15   Cuthriell, Dorn BIRCH, PA-C     Allergies  Patient has no known allergies.   Family History  History reviewed. No pertinent family history.   Physical Exam  Triage Vital Signs: ED Triage Vitals  Encounter Vitals Group     BP 07/16/23 2100 125/73     Systolic BP Percentile --      Diastolic BP Percentile --      Pulse Rate 07/16/23 2100 88     Resp 07/16/23 2100 18     Temp 07/16/23 2100 100 F (37.8 C)     Temp Source 07/16/23 2100 Oral     SpO2 07/16/23 2100 98 %     Weight 07/16/23 2100 170 lb (77.1 kg)     Height 07/16/23 2100 5' 11 (1.803 m)     Head Circumference --       Peak Flow --      Pain Score 07/16/23 2059 8     Pain Loc --      Pain Education --      Exclude from Growth Chart --     Updated Vital Signs: BP 108/69 (BP Location: Left Arm)   Pulse 84   Temp 100.1 F (37.8 C) (Oral)   Resp 17   Ht 5' 11 (1.803 m)   Wt 77.1 kg   LMP 06/25/2023   SpO2 100%   BMI 23.71 kg/m    General: Awake, mild distress.  CV:  RRR.  Good peripheral perfusion.  Resp:  Normal effort.  CTAB. Abd:  No distention.  Other:  Mildly erythematous posterior oropharynx with mild and bilateral symmetrical tonsillar swelling without exudates or peritonsillar abscess.  Hoarse voice noted.  There is no muffled voice or drooling.  Shotty anterior cervical lymphadenopathy noted.   ED Results / Procedures / Treatments  Labs (all labs ordered are listed, but only abnormal results are displayed) Labs Reviewed  GROUP A STREP BY PCR  RESP PANEL BY RT-PCR (RSV, FLU A&B, COVID)  [id number]     EKG  None   RADIOLOGY None  Official radiology report(s): No results found.   PROCEDURES:  Critical Care performed: No  Procedures   MEDICATIONS ORDERED IN ED: Medications  dexamethasone  (DECADRON ) tablet 10 mg (has no administration in time range)  magic mouthwash (has no administration in time range)     IMPRESSION / MDM / ASSESSMENT AND PLAN / ED COURSE  I reviewed the triage vital signs and the nursing notes.                             38 year old female presenting with a 1 day history of cold-like symptoms.  Respiratory panel and group A strep negative.  Will treat symptomatically with single dose Decadron , Magic mouthwash.  Strict return precautions given.  Patient verbalizes understanding and agrees with plan of care.  Patient's presentation is most consistent with acute, uncomplicated illness.    FINAL CLINICAL IMPRESSION(S) / ED DIAGNOSES   Final diagnoses:  Sore throat  Viral pharyngitis     Rx / DC Orders   ED Discharge Orders           Ordered    magic mouthwash SOLN        07/17/23 0122             Note:  This document was prepared using Dragon voice recognition software and may include unintentional dictation errors.   Quanna Wittke J, MD 07/17/23 [phone]

## 2023-07-17 NOTE — Discharge Instructions (Signed)
You may take throat wash as needed for discomfort.  Return to the ER for worsening symptoms, persistent vomiting, fever or other concerns.

## 2024-07-10 ENCOUNTER — Encounter (HOSPITAL_BASED_OUTPATIENT_CLINIC_OR_DEPARTMENT_OTHER): Payer: Self-pay | Admitting: Emergency Medicine

## 2024-07-10 ENCOUNTER — Emergency Department (HOSPITAL_BASED_OUTPATIENT_CLINIC_OR_DEPARTMENT_OTHER)

## 2024-07-10 ENCOUNTER — Inpatient Hospital Stay (HOSPITAL_BASED_OUTPATIENT_CLINIC_OR_DEPARTMENT_OTHER)
Admission: EM | Admit: 2024-07-10 | Discharge: 2024-07-13 | DRG: 175 | Disposition: A | Attending: Family Medicine | Admitting: Family Medicine

## 2024-07-10 ENCOUNTER — Other Ambulatory Visit: Payer: Self-pay

## 2024-07-10 DIAGNOSIS — N63 Unspecified lump in unspecified breast: Secondary | ICD-10-CM

## 2024-07-10 DIAGNOSIS — I2699 Other pulmonary embolism without acute cor pulmonale: Principal | ICD-10-CM | POA: Diagnosis present

## 2024-07-10 DIAGNOSIS — Z79899 Other long term (current) drug therapy: Secondary | ICD-10-CM

## 2024-07-10 DIAGNOSIS — D75839 Thrombocytosis, unspecified: Secondary | ICD-10-CM | POA: Diagnosis present

## 2024-07-10 DIAGNOSIS — D259 Leiomyoma of uterus, unspecified: Secondary | ICD-10-CM | POA: Diagnosis present

## 2024-07-10 DIAGNOSIS — J9601 Acute respiratory failure with hypoxia: Secondary | ICD-10-CM | POA: Diagnosis present

## 2024-07-10 DIAGNOSIS — D509 Iron deficiency anemia, unspecified: Secondary | ICD-10-CM | POA: Diagnosis present

## 2024-07-10 DIAGNOSIS — D649 Anemia, unspecified: Secondary | ICD-10-CM

## 2024-07-10 DIAGNOSIS — I82451 Acute embolism and thrombosis of right peroneal vein: Secondary | ICD-10-CM | POA: Diagnosis present

## 2024-07-10 DIAGNOSIS — N6310 Unspecified lump in the right breast, unspecified quadrant: Secondary | ICD-10-CM | POA: Diagnosis present

## 2024-07-10 DIAGNOSIS — N921 Excessive and frequent menstruation with irregular cycle: Secondary | ICD-10-CM | POA: Diagnosis present

## 2024-07-10 LAB — CBC WITH DIFFERENTIAL/PLATELET
Abs Immature Granulocytes: 0.07 10*3/uL (ref 0.00–0.07)
Basophils Absolute: 0.1 10*3/uL (ref 0.0–0.1)
Basophils Relative: 0 %
Eosinophils Absolute: 0.1 10*3/uL (ref 0.0–0.5)
Eosinophils Relative: 1 %
HCT: 20.7 % — ABNORMAL LOW (ref 36.0–46.0)
Hemoglobin: 5.7 g/dL — CL (ref 12.0–15.0)
Immature Granulocytes: 0 %
Lymphocytes Relative: 19 %
Lymphs Abs: 3.1 10*3/uL (ref 0.7–4.0)
MCH: 18.3 pg — ABNORMAL LOW (ref 26.0–34.0)
MCHC: 27.5 g/dL — ABNORMAL LOW (ref 30.0–36.0)
MCV: 66.6 fL — ABNORMAL LOW (ref 80.0–100.0)
Monocytes Absolute: 1.1 10*3/uL — ABNORMAL HIGH (ref 0.1–1.0)
Monocytes Relative: 7 %
Neutro Abs: 11.5 10*3/uL — ABNORMAL HIGH (ref 1.7–7.7)
Neutrophils Relative %: 73 %
Platelets: 881 10*3/uL — ABNORMAL HIGH (ref 150–400)
RBC: 3.11 MIL/uL — ABNORMAL LOW (ref 3.87–5.11)
RDW: 31.7 % — ABNORMAL HIGH (ref 11.5–15.5)
WBC: 15.9 10*3/uL — ABNORMAL HIGH (ref 4.0–10.5)
nRBC: 0.5 % — ABNORMAL HIGH (ref 0.0–0.2)

## 2024-07-10 LAB — COMPREHENSIVE METABOLIC PANEL WITH GFR
ALT: 10 U/L (ref 0–44)
AST: 15 U/L (ref 15–41)
Albumin: 4.2 g/dL (ref 3.5–5.0)
Alkaline Phosphatase: 57 U/L (ref 38–126)
Anion gap: 11 (ref 5–15)
BUN: 9 mg/dL (ref 6–20)
CO2: 23 mmol/L (ref 22–32)
Calcium: 8.8 mg/dL — ABNORMAL LOW (ref 8.9–10.3)
Chloride: 105 mmol/L (ref 98–111)
Creatinine, Ser: 0.62 mg/dL (ref 0.44–1.00)
GFR, Estimated: >60 mL/min
Glucose, Bld: 87 mg/dL (ref 70–99)
Potassium: 3.7 mmol/L (ref 3.5–5.1)
Sodium: 139 mmol/L (ref 135–145)
Total Bilirubin: 0.3 mg/dL (ref 0.0–1.2)
Total Protein: 8 g/dL (ref 6.5–8.1)

## 2024-07-10 LAB — TROPONIN T, HIGH SENSITIVITY: Troponin T High Sensitivity: <6 ng/L (ref 0–19)

## 2024-07-10 LAB — PREPARE RBC (CROSSMATCH)

## 2024-07-10 LAB — IRON AND TIBC
Iron: <10 ug/dL — ABNORMAL LOW (ref 28–170)
TIBC: 393 ug/dL (ref 250–450)

## 2024-07-10 LAB — ABO/RH: ABO/RH(D): B POS

## 2024-07-10 LAB — FERRITIN: Ferritin: 4 ng/mL — ABNORMAL LOW (ref 11–307)

## 2024-07-10 LAB — HCG, SERUM, QUALITATIVE: Preg, Serum: NEGATIVE

## 2024-07-10 LAB — FOLATE: Folate: 8.7 ng/mL

## 2024-07-10 LAB — OCCULT BLOOD X 1 CARD TO LAB, STOOL: Fecal Occult Bld: NEGATIVE

## 2024-07-10 LAB — LIPASE, BLOOD: Lipase: 23 U/L (ref 11–51)

## 2024-07-10 LAB — RETICULOCYTES
Immature Retic Fract: 12.1 % (ref 2.3–15.9)
RBC.: 2.74 MIL/uL — ABNORMAL LOW (ref 3.87–5.11)
Retic Count, Absolute: 24.7 10*3/uL (ref 19.0–186.0)
Retic Ct Pct: 0.9 % (ref 0.4–3.1)

## 2024-07-10 LAB — PRO BRAIN NATRIURETIC PEPTIDE: Pro Brain Natriuretic Peptide: 55.1 pg/mL

## 2024-07-10 LAB — HEPARIN LEVEL (UNFRACTIONATED): Heparin Unfractionated: 0.53 [IU]/mL (ref 0.30–0.70)

## 2024-07-10 LAB — D-DIMER, QUANTITATIVE: D-Dimer, Quant: 0.76 ug{FEU}/mL — ABNORMAL HIGH (ref 0.00–0.50)

## 2024-07-10 LAB — VITAMIN B12: Vitamin B-12: 596 pg/mL (ref 180–914)

## 2024-07-10 MED ORDER — HEPARIN (PORCINE) 25000 UT/250ML-% IV SOLN
950.0000 [IU]/h | INTRAVENOUS | Status: AC
  Administered 2024-07-10: 1350 [IU]/h via INTRAVENOUS
  Administered 2024-07-11: 1300 [IU]/h via INTRAVENOUS
  Administered 2024-07-12: 1200 [IU]/h via INTRAVENOUS
  Filled 2024-07-10 (×3): qty 250

## 2024-07-10 MED ORDER — FERROUS SULFATE 325 (65 FE) MG PO TABS: 325.0000 mg | ORAL_TABLET | Freq: Every day | ORAL | 0 refills | Status: DC

## 2024-07-10 MED ORDER — ACETAMINOPHEN 650 MG RE SUPP: 650.0000 mg | Freq: Four times a day (QID) | RECTAL | Status: DC | PRN

## 2024-07-10 MED ORDER — ACETAMINOPHEN 325 MG PO TABS
650.0000 mg | ORAL_TABLET | Freq: Four times a day (QID) | ORAL | Status: DC | PRN
  Administered 2024-07-11: 650 mg via ORAL
  Filled 2024-07-10: qty 2

## 2024-07-10 MED ORDER — SODIUM CHLORIDE 0.9% IV SOLUTION: Freq: Once | INTRAVENOUS | Status: AC

## 2024-07-10 MED ORDER — ONDANSETRON HCL 4 MG/2ML IJ SOLN: 4.0000 mg | Freq: Four times a day (QID) | INTRAMUSCULAR | Status: DC | PRN

## 2024-07-10 MED ORDER — HYDROMORPHONE HCL 1 MG/ML IJ SOLN
0.5000 mg | INTRAMUSCULAR | Status: AC | PRN
  Administered 2024-07-10: 0.5 mg via INTRAVENOUS
  Filled 2024-07-10: qty 0.5

## 2024-07-10 MED ORDER — OXYCODONE HCL 5 MG PO TABS
5.0000 mg | ORAL_TABLET | ORAL | Status: DC | PRN
  Administered 2024-07-11 – 2024-07-13 (×5): 5 mg via ORAL
  Filled 2024-07-10 (×5): qty 1

## 2024-07-10 MED ORDER — LIDOCAINE VISCOUS HCL 2 % MT SOLN
15.0000 mL | Freq: Once | OROMUCOSAL | Status: AC
  Administered 2024-07-10: 15 mL via OROMUCOSAL
  Filled 2024-07-10: qty 15

## 2024-07-10 MED ORDER — FENTANYL CITRATE (PF) 50 MCG/ML IJ SOSY
50.0000 ug | PREFILLED_SYRINGE | Freq: Once | INTRAMUSCULAR | Status: AC
  Administered 2024-07-10: 50 ug via INTRAVENOUS
  Filled 2024-07-10: qty 1

## 2024-07-10 MED ORDER — ALUM & MAG HYDROXIDE-SIMETH 200-200-20 MG/5ML PO SUSP
30.0000 mL | Freq: Once | ORAL | Status: AC
  Administered 2024-07-10: 30 mL via ORAL
  Filled 2024-07-10: qty 30

## 2024-07-10 MED ORDER — IOHEXOL 350 MG/ML SOLN
75.0000 mL | Freq: Once | INTRAVENOUS | Status: AC | PRN
  Administered 2024-07-10: 75 mL via INTRAVENOUS

## 2024-07-10 MED ORDER — FUROSEMIDE 10 MG/ML IJ SOLN
20.0000 mg | Freq: Once | INTRAMUSCULAR | Status: AC
  Administered 2024-07-11: 20 mg via INTRAVENOUS
  Filled 2024-07-10: qty 2

## 2024-07-10 MED ORDER — ACETAMINOPHEN 500 MG PO TABS
1000.0000 mg | ORAL_TABLET | Freq: Once | ORAL | Status: AC
  Administered 2024-07-10: 1000 mg via ORAL
  Filled 2024-07-10: qty 2

## 2024-07-10 MED ORDER — ONDANSETRON HCL 4 MG PO TABS: 4.0000 mg | ORAL_TABLET | Freq: Four times a day (QID) | ORAL | Status: DC | PRN

## 2024-07-10 NOTE — ED Triage Notes (Signed)
 Pt c/o mid upper chest pain since last night, worse with talking.

## 2024-07-10 NOTE — Assessment & Plan Note (Signed)
 Patient has had a history of about 1 year of menometrorrhagia.  She will need outpatient GYN for management along with outpatient management of her right breast nodule that will include mammogram plus or minus ultrasound.

## 2024-07-10 NOTE — ED Notes (Signed)
 Called [facility] to transport patient to [facility] to be admitted @15 :50. Spoke with Menisha.

## 2024-07-10 NOTE — ED Provider Notes (Addendum)
 " [facility] Provider Note   CSN: [id number] Arrival date & time: 07/10/24  1122     Patient presents with: Chest Pain   Maria Gillespie is a 39 y.o. female.   Patient here started this morning.  Worse with movement worse with breathing worse to palpation.  No recent surgery or travel.  She is not on any blood thinners.  She does smoke occasionally.  No recent alcohol use.  Not sure if it feels better or worse with eating.  She was at work when this started.  Denies any heavy lifting or trauma.  Denies any recent illness.  No cough no sputum production.  No significant history otherwise.  The history is provided by the patient.       Prior to Admission medications  Medication Sig Start Date End Date Taking? Authorizing Provider  ferrous sulfate  325 (65 FE) MG tablet Take 1 tablet (325 mg total) by mouth daily. 07/10/24  Yes Maria Gillespie, Joshua, PA-C  amoxicillin  (AMOXIL ) 875 MG tablet Take 1 tablet (875 mg total) by mouth 2 (two) times daily. 07/09/23   Maria Shona CROME, PA-C  famotidine  (PEPCID ) 20 MG tablet Take 1 tablet (20 mg total) by mouth 2 (two) times daily. 02/28/21 03/30/21  Maria Burnard Ee, MD  loperamide  (IMODIUM  A-D) 2 MG tablet Take 1 tablet (2 mg total) by mouth 4 (four) times daily as needed for diarrhea or loose stools. 08/10/15   Maria Gillespie, Maria BIRCH, PA-C  magic mouthwash SOLN 12mL Anbesol 30mL Benadryl  30mL Mylanta  5mL swish, gargle & spit q8hr prn throat discomfort 07/17/23   Maria Gillespie, Maria J, MD    Allergies: Patient has no known allergies.    Review of Systems  Updated Vital Signs BP 121/78   Pulse 87   Temp 98.3 F (36.8 C) (Oral)   Resp (!) 25   SpO2 100%   Physical Exam Vitals and nursing note reviewed.  Constitutional:      General: She is not in acute distress.    Appearance: She is well-developed. She is not ill-appearing.  HENT:     Head: Normocephalic and atraumatic.  Eyes:     Extraocular Movements:  Extraocular movements intact.     Conjunctiva/sclera: Conjunctivae normal.     Pupils: Pupils are equal, round, and reactive to light.  Cardiovascular:     Rate and Rhythm: Normal rate and regular rhythm.     Heart sounds: Normal heart sounds. No murmur heard. Pulmonary:     Effort: Pulmonary effort is normal. No respiratory distress.     Comments: Somewhat poor respiratory effort, diminished breath sounds throughout Chest:     Chest wall: Tenderness present.  Abdominal:     Palpations: Abdomen is soft.     Tenderness: There is no abdominal tenderness.  Musculoskeletal:        General: No swelling.     Cervical back: Normal range of motion and neck supple.     Right lower leg: No edema.     Left lower leg: No edema.  Skin:    General: Skin is warm and dry.     Capillary Refill: Capillary refill takes less than 2 seconds.  Neurological:     Mental Status: She is alert.  Psychiatric:        Mood and Affect: Mood normal.     (all labs ordered are listed, but only abnormal results are displayed) Labs Reviewed  CBC WITH DIFFERENTIAL/PLATELET - Abnormal; Notable for  the following components:      Result Value   WBC 15.9 (*)    RBC 3.11 (*)    Hemoglobin 5.7 (*)    HCT 20.7 (*)    MCV 66.6 (*)    MCH 18.3 (*)    MCHC 27.5 (*)    RDW 31.7 (*)    Platelets 881 (*)    nRBC 0.5 (*)    Neutro Abs 11.5 (*)    Monocytes Absolute 1.1 (*)    All other components within normal limits  COMPREHENSIVE METABOLIC PANEL WITH GFR - Abnormal; Notable for the following components:   Calcium 8.8 (*)    All other components within normal limits  D-DIMER, QUANTITATIVE - Abnormal; Notable for the following components:   D-Dimer, Quant 0.76 (*)    All other components within normal limits  RETICULOCYTES - Abnormal; Notable for the following components:   RBC. 2.74 (*)    All other components within normal limits  LIPASE, BLOOD  HCG, SERUM, QUALITATIVE  PRO BRAIN NATRIURETIC PEPTIDE  OCCULT  BLOOD X 1 CARD TO LAB, STOOL  CBC WITH DIFFERENTIAL/PLATELET  VITAMIN B12  FOLATE  IRON AND TIBC  FERRITIN  TROPONIN T, HIGH SENSITIVITY  TROPONIN T, HIGH SENSITIVITY    EKG: EKG Interpretation Date/Time:  Thursday July 10 2024 11:36:25 EST Ventricular Rate:  91 PR Interval:  122 QRS Duration:  85 QT Interval:  425 QTC Calculation: 523 R Axis:   62  Text Interpretation: Sinus rhythm Nonspecific T abnormalities, diffuse leads Confirmed by Maria Gillespie [phone]) on 07/10/2024 11:38:23 AM  Radiology: CT Angio Chest PE W and/or Wo Contrast Result Date: 07/10/2024 CLINICAL DATA:  Chest pain beginning this morning. EXAM: CT ANGIOGRAPHY CHEST WITH CONTRAST TECHNIQUE: Multidetector CT imaging of the chest was performed using the standard protocol during bolus administration of intravenous contrast. Multiplanar CT image reconstructions and MIPs were obtained to evaluate the vascular anatomy. RADIATION DOSE REDUCTION: This exam was performed according to the departmental dose-optimization program which includes automated exposure control, adjustment of the mA and/or kV according to patient size and/or use of iterative reconstruction technique. CONTRAST:  75mL OMNIPAQUE  IOHEXOL  350 MG/ML SOLN COMPARISON:  Plain film of earlier today. FINDINGS: Cardiovascular: The quality of this exam for evaluation of pulmonary embolism is sufficient. Small volume but definite subsegmental filling defects within lingular and left lower lobe pulmonary artery branches including on image 138/304. Also coronal image 92. Normal aortic caliber. Normal heart size, without pericardial effusion. Mediastinum/Nodes: No mediastinal or hilar adenopathy. Minimal residual thymic tissue in the anterior mediastinum is normal for age. Lungs/Pleura: No pleural fluid. Mild subsegmental atelectasis in the inferior lingula and less so lower lobes bilaterally. Upper Abdomen: No significant findings. Musculoskeletal: Right lateral breast 12  mm soft tissue nodule on image 48/302 and coronal image 127. No acute osseous abnormality. Review of the MIP images confirms the above findings. IMPRESSION: 1. Small volume inferior lingula and lateral left lower lobe pulmonary emboli. 2. Right lateral breast 12 mm nodule warrants further evaluation with diagnostic mammogram and ultrasound. 3. A return call from the emergency room clinical service is pending. Electronically Signed   By: Maria Gillespie M.D.   On: 07/10/2024 13:35   DG Chest Portable 1 View Result Date: 07/10/2024 CLINICAL DATA:  Chest pain. EXAM: PORTABLE CHEST 1 VIEW COMPARISON:  None Available. FINDINGS: The heart size and mediastinal contours are within normal limits. Both lungs are clear. The visualized skeletal structures are unremarkable. IMPRESSION: No active disease. Electronically  Signed   By: Maria  Gillespie M.D.   On: 07/10/2024 13:34     .Critical Care  Performed by: Maria Cornet, Maria Gillespie Authorized by: Maria Cornet, Maria Gillespie   Critical care provider statement:    Critical care time (minutes):  45   Critical care was necessary to treat or prevent imminent or life-threatening deterioration of the following conditions: acute PE, symptomatic anemia.   Critical care was time spent personally by me on the following activities:  Development of treatment plan with patient or surrogate, blood draw for specimens, discussions with consultants, discussions with primary provider, evaluation of patient's response to treatment, examination of patient, obtaining history from patient or surrogate, ordering and performing treatments and interventions, ordering and review of laboratory studies, ordering and review of radiographic studies, pulse oximetry, re-evaluation of patient's condition and review of old charts   Care discussed with: admitting provider      Medications Ordered in the ED  acetaminophen  (TYLENOL ) tablet 1,000 mg (1,000 mg Oral Given 07/10/24 1148)  alum & mag hydroxide-simeth  (MAALOX/MYLANTA) 200-200-20 MG/5ML suspension 30 mL (30 mLs Oral Given 07/10/24 1149)  lidocaine  (XYLOCAINE ) 2 % viscous mouth solution 15 mL (15 mLs Mouth/Throat Given 07/10/24 1149)  fentaNYL  (SUBLIMAZE ) injection 50 mcg (50 mcg Intravenous Given 07/10/24 1208)  iohexol  (OMNIPAQUE ) 350 MG/ML injection 75 mL (75 mLs Intravenous Contrast Given 07/10/24 1309)                                    Medical Decision Making Amount and/or Complexity of Data Reviewed Labs: ordered. Radiology: ordered.  Risk OTC drugs. Prescription drug management. Decision regarding hospitalization.   Addisyn Leclaire is here with chest pain.  Normal vitals.  No fever.  Wide differential.  Could be musculoskeletal process versus reflux versus ACS versus PE versus pneumonia versus pneumothorax.  Does not seem to be abdominal in etiology.  Seems less likely pancreatitis or cholecystitis but she does have a history of gallstones.  Seems worse to palpation and worse when she takes a deep breath but overall she is not taking very good breaths secondary to the pain.  I Maria Gillespie think that hear breath sounds bilaterally.  She had normal vitals.  No fever.  EKG shows sinus rhythm.  Some nonspecific T wave changes throughout.  Will check CBC CMP lipase troponin D-dimer BNP chest x-ray give Tylenol  GI cocktail and fentanyl  and reevaluate.  Overall lab work is significant for hemoglobin of 5.7.  Hemoccult negative.  Stools grossly brown.  She does have heavy menstrual cycles from fibroids she suspects as well.  Her platelets are elevated as well.  Will talk with hematology I Maria Gillespie suspect this is an iron deficiency anemia.  Troponin is normal but D-dimer is mildly elevated we will get a CT scan of her chest to further evaluate.  Chest x-ray shows no obvious pneumonia or pneumothorax.  Will reevaluate.  Overall I Dr. Cloretta with hematology review and he also agrees that morphologies and platelet elevation in things that were seen in the CBC are  likely all from iron deficiency anemia.  PE scan however did show left-sided pulmonary emboli as well as breast nodule.  Radiology called me on the phone to let me know.  I Maria Gillespie not think she is having any active bleeding.  I think we are going to have to admit for IV anticoagulation will talk with pulmonology about this and admit her to  the hospitalist for further care.  Talked with Dr. Claudene with pulmonology.  He agrees with doing heparin  bleeding risk protocol observation and make sure she can tolerate and give transfusion for symptomatic anemia/chronic anemia/iron deficiency anemia.  Will admit to hospital service for further care.  Patient made aware of all these findings and risks and benefits.  I think this will be the best treatment for her as we can reverse any issues with heparin .  She also does have a breast nodule that will need to be evaluated, likely outpatient with a mammography.  This chart was dictated using voice recognition software.  Despite best efforts to proofread,  errors can occur which can change the documentation meaning.      Final diagnoses:  Acute pulmonary embolism, unspecified pulmonary embolism type, unspecified whether acute cor pulmonale present (HCC)  Breast nodule  Symptomatic anemia    ED Discharge Orders          Ordered    ferrous sulfate  325 (65 FE) MG tablet  Daily        07/10/24 1305               Maria Cornet, Maria Gillespie 07/10/24 1306    Maria, Rateel Beldin, Maria Gillespie 07/10/24 1327    Jaretzi Droz, Maria Gillespie 07/10/24 1329    Sylvan Sookdeo, Maria Gillespie 07/10/24 1351    Malloree Raboin, Maria Gillespie 07/10/24 1351  "

## 2024-07-10 NOTE — ED Notes (Signed)
 Called [facility] for transport to [facility] ED @13 :34

## 2024-07-10 NOTE — Progress Notes (Signed)
 PHARMACY - ANTICOAGULATION CONSULT NOTE  Pharmacy Consult for heparin  Indication: pulmonary embolus  Allergies[1]  Patient Measurements: Height: 5' 11 (180.3 cm) Weight: 83.4 kg (183 lb 14.4 oz) (bed scale) IBW/kg (Calculated) : 70.8 HEPARIN  DW (KG): 83.4  Vital Signs: Temp: 99 F (37.2 C) (01/29 2245) Temp Source: Oral (01/29 2223) BP: 112/67 (01/29 2245) Pulse Rate: 80 (01/29 2245)  Labs: Recent Labs    07/10/24 1151 07/10/24 2119  HGB 5.7*  --   HCT 20.7*  --   PLT 881*  --   HEPARINUNFRC  --  0.53  CREATININE 0.62  --     Estimated Creatinine Clearance: 106.6 mL/min (by C-G formula based on SCr of 0.62 mg/dL).   Medical History: History reviewed. No pertinent past medical history.  Medications:  Infusions:   heparin  1,350 Units/hr (07/10/24 1425)    Assessment: Maria Gillespie presented to the ED with ShOB. Found to have a PE and now starting IV heparin . Baseline Hgb is low at 5.7 and platelets are elevated. No overt bleeding noted, likely related to iron deficiency. Discussed with provider that we will not bolus heparin  for now and will aim for lower end of the goal range due to anemia.   07/10/24 Heparin  level = 0.53 (slightly above lower goal range) with heparin  gtt @ 1350 units/hr RN reported no bleeding complications  Goal of Therapy:  Heparin  level 0.3-0.5 units/ml due to concerned bleeding risk Monitor platelets by anticoagulation protocol: Yes   Plan:  Decrease Heparin  gtt to 1300 units/hr Check heparin  level with AM labs to confirm therapeutic dose Daily heparin  level and CBC  Arvin Gauss, PharmD 07/10/2024,11:01 PM       [1] No Known Allergies

## 2024-07-10 NOTE — Assessment & Plan Note (Signed)
 Likely iron deficiency based on her menometrorrhagia.  May need IV iron.  Will go ahead and give her 2 units of packed red blood cells.  Patient verbally consented to blood transfusion.  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

## 2024-07-10 NOTE — ED Notes (Signed)
 Notified CCMD of cardiac monitoring

## 2024-07-10 NOTE — Assessment & Plan Note (Signed)
 PCCM rec starting IV heparin  protocol. Check echo. Check LE U/S. Can likely transition to PO DOAC in AM. Unprovoked PE. Will only need 3-6 months of systemic anticoagulation.

## 2024-07-10 NOTE — Hospital Course (Signed)
 CC: chest pain with inspiration HPI: 39 year old African-American female with a history of fibroids that has not yet undergone GYN evaluation, menometrorrhagia over the last year who presents to the ER with sudden onset of chest pain with inspiration that started last night.  Patient states that she had difficulty getting comfortable.  She came to the ER for evaluation.  She relates a history of severe menometrorrhagia.  She has a menstrual cycle about every 14 days.  She has 7 days of heavy bleeding.  She bleeds and requires a tampon and pad change about every 30 minutes.  This has been ongoing for the last year.  She found out about her fibroids in June 2024 on a routine CT scan of her abdomen.  She has not yet found a GYN doctor to monitor or care for her.  While in the ER, she was noted to have have hemoglobin of 5.7.  MCV of 66.6.  Platelets of 881.  Her D-dimer was elevated at 0.76.  CTPA was performed which showed a small lingular and left lateral lobe PE.  She also has a right lateral breast nodule that will require outpatient mammogram.  EDP had a telephone consult with  Rolan Sharps with critical care.  Patient did not require ICU level care.  Critical care recommended starting the patient on IV heparin  to make sure she did not have any vaginal bleeding.  EDP also discussed the patient's anemia with heme-onc.  Patient does likely have iron deficiency anemia.  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  She is agreeable to blood transfusion.  [facility] hospitalist consulted for admission.  Significant Events: Admitted 07/10/2024 for acute PE   Admission Labs: D-dimr 0.76 Pro BNP 55 Na 139, K 3.7, CO2 23, BUN of 9, creatinine 0.76 Total protein 8.0, albumin 4.2, AST 15, ALT 10, alk phos of 57, total bili 0.3 Lipase 23 White count 15.9, hemoglobin 5.7, platelets 881, MCV of 66.6.  Positive for acanthocytes, burr cells, polychromasia, target cells Hemoccult is negative. Pending is  folate, B12, ferritin, TIBC, iron  Admission Imaging Studies: CTPA Small volume inferior lingula and lateral left lower lobe pulmonary emboli. 2. Right lateral breast 12 mm nodule warrants further evaluation with diagnostic mammogram and ultrasound.  Significant Labs:   Significant Imaging Studies:   Antibiotic Therapy: Anti-infectives (From admission, onward)    None       Procedures:   Consultants:

## 2024-07-10 NOTE — Assessment & Plan Note (Signed)
 Will need outpatient referral to GYN for her dysfunctional uterine bleeding and right breast nodule.  She will need outpatient mammogram.

## 2024-07-10 NOTE — H&P (Signed)
 " History and Physical    Chau Savell FMW:[id number] DOB: 1986-03-28 DOA: 07/10/2024  DOS: the patient was seen and examined on 07/10/2024  PCP: [facility]   Referring Provider: [place], DO  Telemedicine Provider: Camellia Door, DO Provider Location: [place], [place] Patient Location: [facility] Referring Diagnosis: acute PE Patient Name and DOB verified: yes Patient consented to Telemedicine Evaluation:yes RN virtual assistant: quintin abernethy, RN Video encounter time and date: 07/10/24 @ 2 pm    Patient coming from: Home  I have personally briefly reviewed patient's old medical records in  Link  CC: chest pain with inspiration HPI: 39 year old African-American female with a history of fibroids that has not yet undergone GYN evaluation, menometrorrhagia over the last year who presents to the ER with sudden onset of chest pain with inspiration that started last night.  Patient states that she had difficulty getting comfortable.  She came to the ER for evaluation.  She relates a history of severe menometrorrhagia.  She has a menstrual cycle about every 14 days.  She has 7 days of heavy bleeding.  She bleeds and requires a tampon and pad change about every 30 minutes.  This has been ongoing for the last year.  She found out about her fibroids in June 2024 on a routine CT scan of her abdomen.  She has not yet found a GYN doctor to monitor or care for her.  While in the ER, she was noted to have have hemoglobin of 5.7.  MCV of 66.6.  Platelets of 881.  Her D-dimer was elevated at 0.76.  CTPA was performed which showed a small lingular and left lateral lobe PE.  She also has a right lateral breast nodule that will require outpatient mammogram.  EDP had a telephone consult with  Rolan Sharps with critical care.  Patient did not require ICU level care.  Critical care recommended starting the patient on IV heparin  to make sure she did not have any vaginal bleeding.  EDP also  discussed the patient's anemia with heme-onc.  Patient does likely have iron deficiency anemia.  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  She is agreeable to blood transfusion.  [facility] hospital[facility] consulted for admission.  Significant Events: Admitted 07/10/2024 for acute PE   Admission Labs: D-dimr 0.76 Pro BNP 55 Na 139, K 3.7, CO2 23, BUN of 9, creatinine 0.76 Total protein 8.0, albumin 4.2, AST 15, ALT 10, alk phos of 57, total bili 0.3 Lipase 23 White count 15.9, hemoglobin 5.7, platelets 881, MCV of 66.6.  Positive for acanthocytes, burr cells, polychromasia, target cells Hemoccult is negative. Pending is folate, B12, ferritin, TIBC, iron  Admission Imaging Studies: CTPA Small volume inferior lingula and lateral left lower lobe pulmonary emboli. 2. Right lateral breast 12 mm nodule warrants further evaluation with diagnostic mammogram and ultrasound.  Significant Labs:   Significant Imaging Studies:   Antibiotic Therapy: Anti-infectives (From admission, onward)    None       Procedures:   Consultants:     ED Course: CTPA shows lingular PE, HgB 5.7  Review of Systems:  Review of Systems  Constitutional: Negative.   HENT: Negative.    Eyes: Negative.   Respiratory: Negative.         Pain in inspiration  Cardiovascular:  Positive for chest pain and leg swelling.  Gastrointestinal: Negative.   Genitourinary:        Menometrorrhagia  Musculoskeletal: Negative.   Skin: Negative.   Neurological: Negative.  Endo/Heme/Allergies: Negative.   Psychiatric/Behavioral: Negative.    All other systems reviewed and are negative.   History reviewed. No pertinent past medical history.  History reviewed. No pertinent surgical history.   reports that she has been smoking cigarettes. She has never used smokeless tobacco. She reports current alcohol use. She reports that she does not use drugs.  Allergies[1]  History reviewed. No pertinent family  history.  Prior to Admission medications  Medication Sig Start Date End Date Taking? Authorizing Provider  ferrous sulfate  325 (65 FE) MG tablet Take 1 tablet (325 mg total) by mouth daily. 07/10/24  Yes Geiple, Joshua, PA-C  amoxicillin  (AMOXIL ) 875 MG tablet Take 1 tablet (875 mg total) by mouth 2 (two) times daily. 07/09/23   Saunders Shona CROME, PA-C  famotidine  (PEPCID ) 20 MG tablet Take 1 tablet (20 mg total) by mouth 2 (two) times daily. 02/28/21 03/30/21  Clide Burnard Ee, MD  loperamide  (IMODIUM  A-D) 2 MG tablet Take 1 tablet (2 mg total) by mouth 4 (four) times daily as needed for diarrhea or loose stools. 08/10/15   Cuthriell, Dorn D, PA-C  magic mouthwash SOLN 12mL Anbesol 30mL Benadryl  30mL Mylanta  5mL swish, gargle & spit q8hr prn throat discomfort 07/17/23   Robinette Vermell PARAS, MD    Physical Exam: Vitals:   07/10/24 1138 07/10/24 1144 07/10/24 1400 07/10/24 1415  BP:      Pulse: 87   76  Resp: (!) 25     Temp:  98.3 F (36.8 C)    TempSrc:  Oral    SpO2: 100%   100%  Weight:   83.4 kg   Height:   5' 11 (1.803 m)    Physical examination performed by Quintin Abernethy, RN. Documented in this H&P for clarity and completeness.  Physical Exam Vitals and nursing note reviewed.  Constitutional:      General: She is not in acute distress.    Appearance: She is normal weight. She is not toxic-appearing or diaphoretic.  HENT:     Head: Normocephalic and atraumatic.     Nose: Nose normal.  Eyes:     General: No scleral icterus. Cardiovascular:     Rate and Rhythm: Normal rate and regular rhythm.  Pulmonary:     Effort: Pulmonary effort is normal. No respiratory distress.     Breath sounds: Normal breath sounds. No wheezing.  Abdominal:     General: Bowel sounds are normal. There is no distension.     Palpations: Abdomen is soft.  Musculoskeletal:     Right lower leg: Edema present.     Left lower leg: Edema present.  Neurological:     Mental Status: She is alert and  oriented to person, place, and time.      Labs on Admission: I have personally reviewed following labs and imaging studies  CBC: Recent Labs  Lab 07/10/24 1151  WBC 15.9*  NEUTROABS 11.5*  HGB 5.7*  HCT 20.7*  MCV 66.6*  PLT 881*   Basic Metabolic Panel: Recent Labs  Lab 07/10/24 1151  NA 139  K 3.7  CL 105  CO2 23  GLUCOSE 87  BUN 9  CREATININE 0.62  CALCIUM 8.8*   GFR: Estimated Creatinine Clearance: 106.6 mL/min (by C-G formula based on SCr of 0.62 mg/dL). Liver Function Tests: Recent Labs  Lab 07/10/24 1151  AST 15  ALT 10  ALKPHOS 57  BILITOT 0.3  PROT 8.0  ALBUMIN 4.2   Recent Labs  Lab 07/10/24 1151  LIPASE 23    ProBNP, BNP (last 5 results) Recent Labs    07/10/24 1151  PROBNP 55.1    Anemia Panel: Recent Labs    07/10/24 1151  RETICCTPCT 0.9   Urine analysis:    Component Value Date/Time   COLORURINE YELLOW (A) 11/25/2022 0901   APPEARANCEUR CLEAR (A) 11/25/2022 0901   APPEARANCEUR Clear 10/28/2013 1716   LABSPEC 1.013 11/25/2022 0901   LABSPEC 1.017 10/28/2013 1716   PHURINE 8.0 11/25/2022 0901   GLUCOSEU NEGATIVE 11/25/2022 0901   GLUCOSEU Negative 10/28/2013 1716   HGBUR SMALL (A) 11/25/2022 0901   BILIRUBINUR NEGATIVE 11/25/2022 0901   BILIRUBINUR Negative 10/28/2013 1716   KETONESUR 20 (A) 11/25/2022 0901   PROTEINUR NEGATIVE 11/25/2022 0901   NITRITE NEGATIVE 11/25/2022 0901   LEUKOCYTESUR NEGATIVE 11/25/2022 0901   LEUKOCYTESUR 2+ 10/28/2013 1716    Radiological Exams on Admission: I have personally reviewed images CT Angio Chest PE W and/or Wo Contrast Addendum Date: 07/10/2024 ADDENDUM REPORT: 07/10/2024 13:57 ADDENDUM: Case discussed with Dr. RUTHE at approximately 1:50 p.m. Electronically Signed   By: Rockey Kilts M.D.   On: 07/10/2024 13:57   Result Date: 07/10/2024 CLINICAL DATA:  Chest pain beginning this morning. EXAM: CT ANGIOGRAPHY CHEST WITH CONTRAST TECHNIQUE: Multidetector CT imaging of the chest  was performed using the standard protocol during bolus administration of intravenous contrast. Multiplanar CT image reconstructions and MIPs were obtained to evaluate the vascular anatomy. RADIATION DOSE REDUCTION: This exam was performed according to the departmental dose-optimization program which includes automated exposure control, adjustment of the mA and/or kV according to patient size and/or use of iterative reconstruction technique. CONTRAST:  75mL OMNIPAQUE  IOHEXOL  350 MG/ML SOLN COMPARISON:  Plain film of earlier today. FINDINGS: Cardiovascular: The quality of this exam for evaluation of pulmonary embolism is sufficient. Small volume but definite subsegmental filling defects within lingular and left lower lobe pulmonary artery branches including on image 138/304. Also coronal image 92. Normal aortic caliber. Normal heart size, without pericardial effusion. Mediastinum/Nodes: No mediastinal or hilar adenopathy. Minimal residual thymic tissue in the anterior mediastinum is normal for age. Lungs/Pleura: No pleural fluid. Mild subsegmental atelectasis in the inferior lingula and less so lower lobes bilaterally. Upper Abdomen: No significant findings. Musculoskeletal: Right lateral breast 12 mm soft tissue nodule on image 48/302 and coronal image 127. No acute osseous abnormality. Review of the MIP images confirms the above findings. IMPRESSION: 1. Small volume inferior lingula and lateral left lower lobe pulmonary emboli. 2. Right lateral breast 12 mm nodule warrants further evaluation with diagnostic mammogram and ultrasound. 3. A return call from the emergency room clinical service is pending. Electronically Signed: By: Rockey Kilts M.D. On: 07/10/2024 13:35   DG Chest Portable 1 View Result Date: 07/10/2024 CLINICAL DATA:  Chest pain. EXAM: PORTABLE CHEST 1 VIEW COMPARISON:  None Available. FINDINGS: The heart size and mediastinal contours are within normal limits. Both lungs are clear. The visualized  skeletal structures are unremarkable. IMPRESSION: No active disease. Electronically Signed   By: Vanetta Chou M.D.   On: 07/10/2024 13:34    EKG: My personal interpretation of EKG shows: NSR     Assessment/Plan Principal Problem:   Acute pulmonary embolism without acute cor pulmonale (HCC) Active Problems:   Menometrorrhagia   Microcytic anemia   Uterine fibroid   Breast nodule - right side   Thrombocytosis    Assessment and Plan: * Acute pulmonary embolism without acute cor pulmonale (HCC) PCCM rec starting IV heparin  protocol. Check  echo. Check LE U/S. Can likely transition to PO DOAC in AM. Unprovoked PE. Will only need 3-6 months of systemic anticoagulation.  Microcytic anemia Likely iron deficiency based on her menometrorrhagia.  May need IV iron.  Will go ahead and give her 2 units of packed red blood cells.  Patient verbally consented to blood transfusion.  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  Menometrorrhagia Patient has had a history of about 1 year of menometrorrhagia.  She will need outpatient GYN for management along with outpatient management of her right breast nodule that will include mammogram plus or minus ultrasound.  Thrombocytosis Repeat CBC in the morning.  Her thrombocytosis may be reactive.  She may need outpatient heme-onc referral if her platelet count does not improve.  Breast nodule - right side Will need outpatient referral to GYN for her dysfunctional uterine bleeding and right breast nodule.  She will need outpatient mammogram.  Uterine fibroid Will need outpatient referral to GYN for her dysfunctional uterine bleeding and right breast nodule.   DVT prophylaxis: IV heparin  gtts Code Status: Full Code Family Communication: no family bedside  Disposition Plan: return home  Consults called: none  Admission status: Inpatient, Telemetry bed   Camellia Door, DO [facility] 07/10/2024, 2:30 PM       [1] No Known Allergies  "

## 2024-07-10 NOTE — Assessment & Plan Note (Signed)
 Will need outpatient referral to GYN for her dysfunctional uterine bleeding and right breast nodule.

## 2024-07-10 NOTE — Progress Notes (Signed)
 07/10/2024 Called for lobar PE plus low Hgb. Question regarding next steps in management. Reasonable to challenge with [facility] + blood transfusion to assure can tolerate. Does need additional workup for anemia thought to be related to GYN issues. GYN consult vs. OP f/u would be reasonable; noted last GYN imaging 2024 unsure when last GYN exam If does not tolerate AC would do LE duplex as well as consider filter Also needs mammogram vs. US  and age appropriate cancer screening Please let us  know if any questions or concerns  Rolan Sharps MD PCCM

## 2024-07-10 NOTE — Progress Notes (Signed)
 PHARMACY - ANTICOAGULATION CONSULT NOTE  Pharmacy Consult for heparin  Indication: pulmonary embolus  Allergies[1]  Patient Measurements: Height: 5' 11 (180.3 cm) Weight: 83.4 kg (183 lb 14.4 oz) (bed scale) IBW/kg (Calculated) : 70.8 HEPARIN  DW (KG): 83.4  Vital Signs: Temp: 98.3 F (36.8 C) (01/29 1144) Temp Source: Oral (01/29 1144) BP: 121/78 (01/29 1133) Pulse Rate: 87 (01/29 1138)  Labs: Recent Labs    07/10/24 1151  HGB 5.7*  HCT 20.7*  PLT 881*  CREATININE 0.62    Estimated Creatinine Clearance: 106.6 mL/min (by C-G formula based on SCr of 0.62 mg/dL).   Medical History: History reviewed. No pertinent past medical history.  Medications:  Infusions:   heparin       Assessment: 67 yof presented to the ED with ShOB. Found to have a PE and now starting IV heparin . Baseline Hgb is low at 5.7 and platelets are elevated. No overt bleeding noted, likely related to iron deficiency. Discussed with provider that we will not bolus heparin  for now and will aim for lower end of the goal range due to anemia.   Goal of Therapy:  Heparin  level 0.3-0.7 units/ml Monitor platelets by anticoagulation protocol: Yes   Plan:  Heparin  gtt 1350 units/hr Check an ~6 hr heparin  level Daily heparin  level and CBC  Jazmynn Pho, Vernell Helling 07/10/2024,2:14 PM      [1] No Known Allergies

## 2024-07-10 NOTE — Assessment & Plan Note (Signed)
 Repeat CBC in the morning.  Her thrombocytosis may be reactive.  She may need outpatient heme-onc referral if her platelet count does not improve.

## 2024-07-10 NOTE — Discharge Instructions (Addendum)
 Follow-up with women's health care to further discuss your menstrual bleeding which I suspect is the cause of your anemia.  Take iron  as prescribed.  Information on my medicine - ELIQUIS  (apixaban )  This medication education was reviewed with me or my healthcare representative as part of my discharge preparation.    Why was Eliquis  prescribed for you? Eliquis  was prescribed to treat blood clots that may have been found in the veins of your legs (deep vein thrombosis) or in your lungs (pulmonary embolism) and to reduce the risk of them occurring again.  What do You need to know about Eliquis  ? The starting dose is 10 mg (two 5 mg tablets) taken TWICE daily for the FIRST SEVEN (7) DAYS, then on (enter date)  07/19/24  the dose is reduced to ONE 5 mg tablet taken TWICE daily.  Eliquis  may be taken with or without food.   Try to take the dose about the same time in the morning and in the evening. If you have difficulty swallowing the tablet whole please discuss with your pharmacist how to take the medication safely.  Take Eliquis  exactly as prescribed and DO NOT stop taking Eliquis  without talking to the doctor who prescribed the medication.  Stopping may increase your risk of developing a new blood clot.  Refill your prescription before you run out.  After discharge, you should have regular check-up appointments with your healthcare provider that is prescribing your Eliquis .    What do you do if you miss a dose? If a dose of ELIQUIS  is not taken at the scheduled time, take it as soon as possible on the same day and twice-daily administration should be resumed. The dose should not be doubled to make up for a missed dose.  Important Safety Information A possible side effect of Eliquis  is bleeding. You should call your healthcare provider right away if you experience any of the following: Bleeding from an injury or your nose that does not stop. Unusual colored urine (red or dark brown) or  unusual colored stools (red or black). Unusual bruising for unknown reasons. A serious fall or if you hit your head (even if there is no bleeding).  Some medicines may interact with Eliquis  and might increase your risk of bleeding or clotting while on Eliquis . To help avoid this, consult your healthcare provider or pharmacist prior to using any new prescription or non-prescription medications, including herbals, vitamins, non-steroidal anti-inflammatory drugs (NSAIDs) and supplements.  This website has more information on Eliquis  (apixaban ): http://www.eliquis .com/eliquis dena

## 2024-07-11 ENCOUNTER — Observation Stay (HOSPITAL_COMMUNITY)

## 2024-07-11 ENCOUNTER — Other Ambulatory Visit (HOSPITAL_COMMUNITY): Payer: Self-pay

## 2024-07-11 ENCOUNTER — Telehealth (HOSPITAL_COMMUNITY): Payer: Self-pay

## 2024-07-11 DIAGNOSIS — N921 Excessive and frequent menstruation with irregular cycle: Secondary | ICD-10-CM | POA: Diagnosis present

## 2024-07-11 DIAGNOSIS — D509 Iron deficiency anemia, unspecified: Secondary | ICD-10-CM | POA: Diagnosis present

## 2024-07-11 DIAGNOSIS — M7989 Other specified soft tissue disorders: Secondary | ICD-10-CM | POA: Diagnosis not present

## 2024-07-11 DIAGNOSIS — Z79899 Other long term (current) drug therapy: Secondary | ICD-10-CM | POA: Diagnosis not present

## 2024-07-11 DIAGNOSIS — I2699 Other pulmonary embolism without acute cor pulmonale: Principal | ICD-10-CM

## 2024-07-11 DIAGNOSIS — N6310 Unspecified lump in the right breast, unspecified quadrant: Secondary | ICD-10-CM | POA: Diagnosis present

## 2024-07-11 DIAGNOSIS — I2602 Saddle embolus of pulmonary artery with acute cor pulmonale: Secondary | ICD-10-CM

## 2024-07-11 DIAGNOSIS — J9601 Acute respiratory failure with hypoxia: Secondary | ICD-10-CM | POA: Diagnosis present

## 2024-07-11 DIAGNOSIS — I82451 Acute embolism and thrombosis of right peroneal vein: Secondary | ICD-10-CM | POA: Diagnosis present

## 2024-07-11 DIAGNOSIS — D75839 Thrombocytosis, unspecified: Secondary | ICD-10-CM | POA: Diagnosis present

## 2024-07-11 DIAGNOSIS — D259 Leiomyoma of uterus, unspecified: Secondary | ICD-10-CM | POA: Diagnosis present

## 2024-07-11 LAB — COMPREHENSIVE METABOLIC PANEL WITH GFR
ALT: 12 U/L (ref 0–44)
AST: 31 U/L (ref 15–41)
Albumin: 4.1 g/dL (ref 3.5–5.0)
Alkaline Phosphatase: 66 U/L (ref 38–126)
Anion gap: 11 (ref 5–15)
BUN: 6 mg/dL (ref 6–20)
CO2: 23 mmol/L (ref 22–32)
Calcium: 9.2 mg/dL (ref 8.9–10.3)
Chloride: 101 mmol/L (ref 98–111)
Creatinine, Ser: 0.64 mg/dL (ref 0.44–1.00)
GFR, Estimated: >60 mL/min
Glucose, Bld: 86 mg/dL (ref 70–99)
Potassium: 3.4 mmol/L — ABNORMAL LOW (ref 3.5–5.1)
Sodium: 135 mmol/L (ref 135–145)
Total Bilirubin: 0.7 mg/dL (ref 0.0–1.2)
Total Protein: 8 g/dL (ref 6.5–8.1)

## 2024-07-11 LAB — CBC WITH DIFFERENTIAL/PLATELET
Abs Immature Granulocytes: 0.08 10*3/uL — ABNORMAL HIGH (ref 0.00–0.07)
Basophils Absolute: 0.1 10*3/uL (ref 0.0–0.1)
Basophils Relative: 1 %
Eosinophils Absolute: 0 10*3/uL (ref 0.0–0.5)
Eosinophils Relative: 0 %
HCT: 27.5 % — ABNORMAL LOW (ref 36.0–46.0)
Hemoglobin: 8.1 g/dL — ABNORMAL LOW (ref 12.0–15.0)
Immature Granulocytes: 1 %
Lymphocytes Relative: 24 %
Lymphs Abs: 4 10*3/uL (ref 0.7–4.0)
MCH: 21.2 pg — ABNORMAL LOW (ref 26.0–34.0)
MCHC: 29.5 g/dL — ABNORMAL LOW (ref 30.0–36.0)
MCV: 72 fL — ABNORMAL LOW (ref 80.0–100.0)
Monocytes Absolute: 1.2 10*3/uL — ABNORMAL HIGH (ref 0.1–1.0)
Monocytes Relative: 7 %
Neutro Abs: 11.2 10*3/uL — ABNORMAL HIGH (ref 1.7–7.7)
Neutrophils Relative %: 67 %
Platelets: 811 10*3/uL — ABNORMAL HIGH (ref 150–400)
RBC: 3.82 MIL/uL — ABNORMAL LOW (ref 3.87–5.11)
RDW: 32.5 % — ABNORMAL HIGH (ref 11.5–15.5)
Smear Review: NORMAL
WBC: 16.5 10*3/uL — ABNORMAL HIGH (ref 4.0–10.5)
nRBC: 0.6 % — ABNORMAL HIGH (ref 0.0–0.2)

## 2024-07-11 LAB — ECHOCARDIOGRAM COMPLETE
Area-P 1/2: 3.76 cm2
Calc EF: 63.3 %
Height: 71 in
S' Lateral: 3.2 cm
Single Plane A2C EF: 58.7 %
Single Plane A4C EF: 68.7 %
Weight: 2942.4 [oz_av]

## 2024-07-11 LAB — HEPARIN LEVEL (UNFRACTIONATED)
Heparin Unfractionated: 0.68 [IU]/mL (ref 0.30–0.70)
Heparin Unfractionated: 0.56 [IU]/mL (ref 0.30–0.70)

## 2024-07-11 NOTE — Progress Notes (Signed)
 2D echo attempted, Patient wants to eat her food. Will try later

## 2024-07-11 NOTE — Progress Notes (Signed)
 PHARMACY - ANTICOAGULATION CONSULT NOTE  Pharmacy Consult for heparin  Indication: pulmonary embolus  Allergies[1]  Patient Measurements: Height: 5' 11 (180.3 cm) Weight: 83.4 kg (183 lb 14.4 oz) (bed scale) IBW/kg (Calculated) : 70.8 HEPARIN  DW (KG): 83.4  Vital Signs: Temp: 99.5 F (37.5 C) (01/30 0410) Temp Source: Oral (01/30 0410) BP: 113/66 (01/30 0410) Pulse Rate: 75 (01/30 0410)  Labs: Recent Labs    07/10/24 1151 07/10/24 2119 07/11/24 0719  HGB 5.7*  --  8.1*  HCT 20.7*  --  27.5*  PLT 881*  --  811*  HEPARINUNFRC  --  0.53 0.68  CREATININE 0.62  --  0.64    Estimated Creatinine Clearance: 106.6 mL/min (by C-G formula based on SCr of 0.64 mg/dL).   Medical History: History reviewed. No pertinent past medical history.  Medications:  Infusions:   heparin  1,300 Units/hr (07/11/24 0841)    Assessment: 38 yof presented to the ED with ShOB. Found to have a PE and started on heparin  infusion. Baseline Hgb is low at 5.7 and platelets are elevated. No overt bleeding noted, likely related to iron  deficiency in setting of menometrorrhagia.   Currently deferring heparin  bolus and aiming for lower end of heparin  goal.  07/11/24 Heparin  level 0.68 - therapeutic but increased from prior despite decrease in heparin  to 1300 units/hr Hgb up to 8.1 following transfusion; plt remain elevated No complications of therapy noted  Goal of Therapy:  Heparin  level 0.3-0.5 units/ml due to concern for bleeding risk Monitor platelets by anticoagulation protocol: Yes   Plan:  -Decrease heparin  infusion to 1200 units/hr -Check heparin  level 6 hours after rate decrease -Daily heparin  level and CBC   Stefano MARLA Bologna, PharmD, BCPS Clinical Pharmacist 07/11/2024 10:51 AM         [1] No Known Allergies

## 2024-07-11 NOTE — Progress Notes (Signed)
 " PROGRESS NOTE    Maria Gillespie  FMW:[id number] DOB: [birth year] DOA: 07/10/2024 PCP: [facility]   Brief Narrative:  This 39 yrs old African-American female with history of fibroids for which she has not undergone GYN evaluation, menometrorrhagia over the last year who presented to the ED with sudden onset of chest pain with inspiration that started last night.  Patient states she was not getting comfortable. She reports her menstrual cycle happens every 14 days and last for 7 days.  She has been using tampons almost every 30 minutes.  This has been ongoing for last year.  She was found to have fibroids in June 2024 on a routine CT scan of her abdomen.  She has not contacted GYN so far. She was found to have hemoglobin of 5.7.  MCV 66.6.  D-dimer was elevated.  CTA chest ruled in small lingular and left lateral lobe PE.  Patient also has right lateral breast nodule which requires outpatient mammogram.  Patient was admitted for further evaluation and started on IV heparin .  Assessment & Plan:   Principal Problem:   Acute pulmonary embolism without acute cor pulmonale (HCC) Active Problems:   Menometrorrhagia   Microcytic anemia   Uterine fibroid   Breast nodule - right side   Thrombocytosis  Acute hypoxic respiratory failure: Acute pulmonary embolism without acute cor pulmonale: Acute right lower extremity DVT: Patient presented with intermittent chest pain with inspiration. CTA chest ruled in pulmonary embolism. PCCM recommended starting IV heparin  protocol.  Obtain 2D echocardiogram. Venous duplex confirms acute DVT in the right peroneal veins.   Likely plan to transition to DOAC before discharge.   Unprovoked PE. Will only need 3-6 months of systemic anticoagulation. Continue supplemental oxygen and wean as tolerated.   Microcytic anemia: Likely iron  deficiency based on her menometrorrhagia.   Found to have hemoglobin 5.3.  Status post 2 unit PRBC.  Hemoglobin  improved to 8.1.  Continue to monitor H&H.   Menometrorrhagia: Patient has had  history of about 1 year of menometrorrhagia.   She will need outpatient GYN for management along with outpatient management of her right breast nodule that will include mammogram plus or minus ultrasound.   Thrombocytosis: Her thrombocytosis may be reactive.  She may need outpatient heme-onc referral if her platelet count does not improve.   Breast nodule - right side Will need outpatient referral to GYN for her dysfunctional uterine bleeding and right breast nodule.  She will need outpatient mammogram.   Uterine fibroid: Will need outpatient referral to GYN for her dysfunctional uterine bleeding and right breast nodule.   DVT prophylaxis: Heparin  IV Code Status:Full code Family Communication:No family at bedside Disposition Plan:     Status is: Inpatient Remains inpatient appropriate because: Patient admitted for anemia  requiring blood transfusion and PE requiring IV heparin .  Patient not medically ready for discharge yet.   Consultants:  None  Procedures: CTA CHEST  Antimicrobials: None  Subjective: Patient was seen and examined at bedside.  Overnight events noted. Patient still reports having intermittent chest pain,  denies any shortness of breath. She remains on 2 L of supplemental oxygen,  at baseline she does not use oxygen.   Objective: Vitals:   07/11/24 0145 07/11/24 0410 07/11/24 1101 07/11/24 1334  BP: 119/83 113/66 111/61 109/63  Pulse: 82 75 80 77  Resp: 20 20 18 18   Temp: 99 F (37.2 C) 99.5 F (37.5 C) 100 F (37.8 C) 98.6 F (37 C)  TempSrc:  Oral Oral Oral Oral  SpO2:  100% 100%   Weight:      Height:        Intake/Output Summary (Last 24 hours) at 07/11/2024 1351 Last data filed at 07/11/2024 0618 Gross per 24 hour  Intake 1555.63 ml  Output --  Net 1555.63 ml   Filed Weights   07/10/24 1400  Weight: 83.4 kg    Examination:  General exam: Appears  calm and comfortable, not in any acute distress. Respiratory system: CTA Bilaterally. Respiratory effort normal.  RR 15 Cardiovascular system: S1 & S2 heard, RRR. No JVD, murmurs, rubs, gallops or clicks.  Gastrointestinal system: Abdomen is non distended, soft and non tender.  Normal bowel sounds heard. Central nervous system: Alert and oriented x 3. No focal neurological deficits. Extremities: No edema, no cyanosis, no clubbing. Skin: No rashes, lesions or ulcers Psychiatry: Judgement and insight appear normal. Mood & affect appropriate.     Data Reviewed: I have personally reviewed following labs and imaging studies  CBC: Recent Labs  Lab 07/10/24 1151 07/11/24 0719  WBC 15.9* 16.5*  NEUTROABS 11.5* 11.2*  HGB 5.7* 8.1*  HCT 20.7* 27.5*  MCV 66.6* 72.0*  PLT 881* 811*   Basic Metabolic Panel: Recent Labs  Lab 07/10/24 1151 07/11/24 0719  NA 139 135  K 3.7 3.4*  CL 105 101  CO2 23 23  GLUCOSE 87 86  BUN 9 6  CREATININE 0.62 0.64  CALCIUM 8.8* 9.2   GFR: Estimated Creatinine Clearance: 106.6 mL/min (by C-G formula based on SCr of 0.64 mg/dL). Liver Function Tests: Recent Labs  Lab 07/10/24 1151 07/11/24 0719  AST 15 31  ALT 10 12  ALKPHOS 57 66  BILITOT 0.3 0.7  PROT 8.0 8.0  ALBUMIN 4.2 4.1   Recent Labs  Lab 07/10/24 1151  LIPASE 23   No results for input(s): AMMONIA in the last 168 hours. Coagulation Profile: No results for input(s): INR, PROTIME in the last 168 hours. Cardiac Enzymes: No results for input(s): CKTOTAL, CKMB, CKMBINDEX, TROPONINI in the last 168 hours. BNP (last 3 results) Recent Labs    07/10/24 1151  PROBNP 55.1   HbA1C: No results for input(s): HGBA1C in the last 72 hours. CBG: No results for input(s): GLUCAP in the last 168 hours. Lipid Profile: No results for input(s): CHOL, HDL, LDLCALC, TRIG, CHOLHDL, LDLDIRECT in the last 72 hours. Thyroid Function Tests: No results for input(s):  TSH, T4TOTAL, FREET4, T3FREE, THYROIDAB in the last 72 hours. Anemia Panel: Recent Labs    07/10/24 1151 07/10/24 1300  VITAMINB12  --  596  FOLATE  --  8.7  FERRITIN  --  4*  TIBC  --  393  IRON   --  <10*  RETICCTPCT 0.9  --    Sepsis Labs: No results for input(s): PROCALCITON, LATICACIDVEN in the last 168 hours.  No results found for this or any previous visit (from the past 240 hours).   Radiology Studies: VAS US  LOWER EXTREMITY VENOUS (DVT) Result Date: 07/11/2024  Lower Venous DVT Study Patient Name:  Maria Gillespie  Date of Exam:   07/11/2024 Medical Rec #: [id number]     Accession #:    [id number] Date of Birth: 08/12/1985     Patient Gender: F Patient Age:   63 years Exam Location:  [facility] Procedure:      VAS US  LOWER EXTREMITY VENOUS (DVT) Referring Phys: ERIC CHEN --------------------------------------------------------------------------------  Indications: SOB, and pulmonary embolism.  Risk Factors: Confirmed PE.  Anticoagulation: Heparin . Comparison Study: No priors. Performing Technologist: Ricka Sturdivant-Jones RDMS, RVT  Examination Guidelines: A complete evaluation includes B-mode imaging, spectral Doppler, color Doppler, and power Doppler as needed of all accessible portions of each vessel. Bilateral testing is considered an integral part of a complete examination. Limited examinations for reoccurring indications may be performed as noted. The reflux portion of the exam is performed with the patient in reverse Trendelenburg.  +---------+---------------+---------+-----------+----------+--------------+ RIGHT    CompressibilityPhasicitySpontaneityPropertiesThrombus Aging +---------+---------------+---------+-----------+----------+--------------+ CFV      Full           Yes      Yes                                 +---------+---------------+---------+-----------+----------+--------------+ SFJ      Full                                                         +---------+---------------+---------+-----------+----------+--------------+ FV Prox  Full                                                        +---------+---------------+---------+-----------+----------+--------------+ FV Mid   Full           Yes      Yes                                 +---------+---------------+---------+-----------+----------+--------------+ FV DistalFull                                                        +---------+---------------+---------+-----------+----------+--------------+ PFV      Full                                                        +---------+---------------+---------+-----------+----------+--------------+ POP      Full           Yes      Yes                                 +---------+---------------+---------+-----------+----------+--------------+ PTV      Full                                                        +---------+---------------+---------+-----------+----------+--------------+ PERO     Partial  Acute          +---------+---------------+---------+-----------+----------+--------------+ Acute DVT in one of the paired peroneal veins.  +---------+---------------+---------+-----------+----------+--------------+ LEFT     CompressibilityPhasicitySpontaneityPropertiesThrombus Aging +---------+---------------+---------+-----------+----------+--------------+ CFV      Full           Yes      Yes                                 +---------+---------------+---------+-----------+----------+--------------+ SFJ      Full                                                        +---------+---------------+---------+-----------+----------+--------------+ FV Prox  Full                                                        +---------+---------------+---------+-----------+----------+--------------+ FV Mid   Full           Yes      Yes                                  +---------+---------------+---------+-----------+----------+--------------+ FV DistalFull                                                        +---------+---------------+---------+-----------+----------+--------------+ PFV      Full                                                        +---------+---------------+---------+-----------+----------+--------------+ POP      Full           Yes      Yes                                 +---------+---------------+---------+-----------+----------+--------------+ PTV      Full                                                        +---------+---------------+---------+-----------+----------+--------------+ PERO     Full                                                        +---------+---------------+---------+-----------+----------+--------------+    Summary: BILATERAL: -No evidence of popliteal cyst, bilaterally. RIGHT: - Findings consistent with acute deep vein thrombosis involving the right peroneal veins.  - All other veins are  patent without evidence of DVT.  LEFT: - There is no evidence of deep vein thrombosis in the lower extremity.  *See table(s) above for measurements and observations.    Preliminary    CT Angio Chest PE W and/or Wo Contrast Addendum Date: 07/10/2024 ADDENDUM REPORT: 07/10/2024 13:57 ADDENDUM: Case discussed with Dr. RUTHE at approximately 1:50 p.m. Electronically Signed   By: Rockey Kilts M.D.   On: 07/10/2024 13:57   Result Date: 07/10/2024 CLINICAL DATA:  Chest pain beginning this morning. EXAM: CT ANGIOGRAPHY CHEST WITH CONTRAST TECHNIQUE: Multidetector CT imaging of the chest was performed using the standard protocol during bolus administration of intravenous contrast. Multiplanar CT image reconstructions and MIPs were obtained to evaluate the vascular anatomy. RADIATION DOSE REDUCTION: This exam was performed according to the departmental dose-optimization program which includes  automated exposure control, adjustment of the mA and/or kV according to patient size and/or use of iterative reconstruction technique. CONTRAST:  75mL OMNIPAQUE  IOHEXOL  350 MG/ML SOLN COMPARISON:  Plain film of earlier today. FINDINGS: Cardiovascular: The quality of this exam for evaluation of pulmonary embolism is sufficient. Small volume but definite subsegmental filling defects within lingular and left lower lobe pulmonary artery branches including on image 138/304. Also coronal image 92. Normal aortic caliber. Normal heart size, without pericardial effusion. Mediastinum/Nodes: No mediastinal or hilar adenopathy. Minimal residual thymic tissue in the anterior mediastinum is normal for age. Lungs/Pleura: No pleural fluid. Mild subsegmental atelectasis in the inferior lingula and less so lower lobes bilaterally. Upper Abdomen: No significant findings. Musculoskeletal: Right lateral breast 12 mm soft tissue nodule on image 48/302 and coronal image 127. No acute osseous abnormality. Review of the MIP images confirms the above findings. IMPRESSION: 1. Small volume inferior lingula and lateral left lower lobe pulmonary emboli. 2. Right lateral breast 12 mm nodule warrants further evaluation with diagnostic mammogram and ultrasound. 3. A return call from the emergency room clinical service is pending. Electronically Signed: By: Rockey Kilts M.D. On: 07/10/2024 13:35   DG Chest Portable 1 View Result Date: 07/10/2024 CLINICAL DATA:  Chest pain. EXAM: PORTABLE CHEST 1 VIEW COMPARISON:  None Available. FINDINGS: The heart size and mediastinal contours are within normal limits. Both lungs are clear. The visualized skeletal structures are unremarkable. IMPRESSION: No active disease. Electronically Signed   By: Vanetta Chou M.D.   On: 07/10/2024 13:34   Scheduled Meds: Continuous Infusions:  heparin  1,200 Units/hr (07/11/24 1057)     LOS: 0 days    Time spent: 50 mins    Darcel Dawley, MD [facility]   If 7PM-7AM, please contact night-coverage  "

## 2024-07-11 NOTE — Progress Notes (Signed)
" °  Echocardiogram 2D Echocardiogram has been performed.  Devora Ellouise SAUNDERS 07/11/2024, 3:52 PM "

## 2024-07-11 NOTE — Progress Notes (Signed)
 PHARMACY - ANTICOAGULATION CONSULT NOTE  Pharmacy Consult for heparin  Indication: pulmonary embolus  Allergies[1]  Patient Measurements: Height: 5' 11 (180.3 cm) Weight: 83.4 kg (183 lb 14.4 oz) (bed scale) IBW/kg (Calculated) : 70.8 HEPARIN  DW (KG): 83.4  Vital Signs: Temp: 98.6 F (37 C) (01/30 1334) Temp Source: Oral (01/30 1334) BP: 109/63 (01/30 1334) Pulse Rate: 77 (01/30 1334)  Labs: Recent Labs    07/10/24 1151 07/10/24 2119 07/11/24 0719 07/11/24 1641  HGB 5.7*  --  8.1*  --   HCT 20.7*  --  27.5*  --   PLT 881*  --  811*  --   HEPARINUNFRC  --  0.53 0.68 0.56  CREATININE 0.62  --  0.64  --     Estimated Creatinine Clearance: 106.6 mL/min (by C-G formula based on SCr of 0.64 mg/dL).   Medical History: History reviewed. No pertinent past medical history.  Medications:  Infusions:   heparin  1,200 Units/hr (07/11/24 1057)    Assessment: 38 yof presented to the ED with ShOB. Found to have a PE and R DVT and started on heparin  infusion. Baseline Hgb is low at 5.7 and platelets are elevated. No overt bleeding noted, likely related to iron  deficiency in setting of menometrorrhagia.   Currently deferring heparin  bolus and aiming for lower end of heparin  goal.  07/11/24 PM Heparin  level 0.56 - slightly above therapeutic goal, with infusion running at 1200 units/hr  Hgb up to 8.1 following transfusion; plt remain elevated No complications of therapy noted  Goal of Therapy:  Heparin  level 0.3-0.5 units/ml due to concern for bleeding risk Monitor platelets by anticoagulation protocol: Yes   Plan:  -Continue heparin  infusion at 1200 units/hr -Check daily heparin  level and CBC -Continue to monitor for signs and symptoms of bleeding    Marget Hench, PharmD Clinical Pharmacist 07/11/2024 5:33 PM          [1] No Known Allergies

## 2024-07-11 NOTE — Progress Notes (Signed)
 Venous duplex lower ext  has been completed. Refer to [facility] under chart review to view preliminary results. Results given to patient's nurse.   07/11/2024  9:06 AM Harkirat Orozco, Ricka BIRCH

## 2024-07-11 NOTE — Plan of Care (Signed)

## 2024-07-11 NOTE — Progress Notes (Signed)
 2D echo attempted, patient having procedure per RN. Will try later

## 2024-07-11 NOTE — Telephone Encounter (Signed)
 Pharmacy Patient Advocate Encounter  Insurance verification completed.    The patient is insured through [facility]. Patient has [employer], may use a copay card, and/or apply for patient assistance if available.    Ran test claim for Eliquis  5mg  tablet and the current 30 day co-pay is $40.  Ran test claim for Xarelto 20mg  tablet and the current 30 day co-pay is $40.  This test claim was processed through [employer]- copay amounts may vary at other pharmacies due to [employer], or as the patient moves through the different stages of their insurance plan.

## 2024-07-11 NOTE — Plan of Care (Signed)
   Problem: Activity: Goal: Risk for activity intolerance will decrease Outcome: Progressing   Problem: Nutrition: Goal: Adequate nutrition will be maintained Outcome: Progressing   Problem: Coping: Goal: Level of anxiety will decrease Outcome: Progressing   Problem: Safety: Goal: Ability to remain free from injury will improve Outcome: Progressing

## 2024-07-12 DIAGNOSIS — I2699 Other pulmonary embolism without acute cor pulmonale: Secondary | ICD-10-CM | POA: Diagnosis not present

## 2024-07-12 LAB — MISC LABCORP TEST (SEND OUT): Labcorp test code: 83935

## 2024-07-12 LAB — BASIC METABOLIC PANEL WITH GFR
Anion gap: 8 (ref 5–15)
BUN: 8 mg/dL (ref 6–20)
CO2: 26 mmol/L (ref 22–32)
Calcium: 8.9 mg/dL (ref 8.9–10.3)
Chloride: 105 mmol/L (ref 98–111)
Creatinine, Ser: 0.61 mg/dL (ref 0.44–1.00)
GFR, Estimated: >60 mL/min
Glucose, Bld: 110 mg/dL — ABNORMAL HIGH (ref 70–99)
Potassium: 3.7 mmol/L (ref 3.5–5.1)
Sodium: 138 mmol/L (ref 135–145)

## 2024-07-12 LAB — CBC
HCT: 24.7 % — ABNORMAL LOW (ref 36.0–46.0)
Hemoglobin: 7.6 g/dL — ABNORMAL LOW (ref 12.0–15.0)
MCH: 21.7 pg — ABNORMAL LOW (ref 26.0–34.0)
MCHC: 30.8 g/dL (ref 30.0–36.0)
MCV: 70.4 fL — ABNORMAL LOW (ref 80.0–100.0)
Platelets: 722 10*3/uL — ABNORMAL HIGH (ref 150–400)
RBC: 3.51 MIL/uL — ABNORMAL LOW (ref 3.87–5.11)
RDW: 32.5 % — ABNORMAL HIGH (ref 11.5–15.5)
WBC: 15.2 10*3/uL — ABNORMAL HIGH (ref 4.0–10.5)
nRBC: 0.3 % — ABNORMAL HIGH (ref 0.0–0.2)

## 2024-07-12 LAB — PHOSPHORUS: Phosphorus: 3.9 mg/dL (ref 2.5–4.6)

## 2024-07-12 LAB — TROPONIN T, HIGH SENSITIVITY
Troponin T High Sensitivity: <6 ng/L (ref 0–19)
Troponin T High Sensitivity: <6 ng/L (ref 0–19)

## 2024-07-12 LAB — HEPARIN LEVEL (UNFRACTIONATED): Heparin Unfractionated: 0.73 [IU]/mL — ABNORMAL HIGH (ref 0.30–0.70)

## 2024-07-12 LAB — MAGNESIUM: Magnesium: 2.3 mg/dL (ref 1.7–2.4)

## 2024-07-12 MED ORDER — NITROGLYCERIN 0.4 MG SL SUBL
SUBLINGUAL_TABLET | SUBLINGUAL | Status: AC
  Filled 2024-07-12: qty 1

## 2024-07-12 MED ORDER — APIXABAN 5 MG PO TABS
10.0000 mg | ORAL_TABLET | Freq: Two times a day (BID) | ORAL | Status: DC
  Administered 2024-07-12 – 2024-07-13 (×3): 10 mg via ORAL
  Filled 2024-07-12 (×3): qty 2

## 2024-07-12 MED ORDER — APIXABAN 5 MG PO TABS: 5.0000 mg | ORAL_TABLET | Freq: Two times a day (BID) | ORAL | Status: DC

## 2024-07-12 MED ORDER — NITROGLYCERIN 0.4 MG SL SUBL
0.4000 mg | SUBLINGUAL_TABLET | SUBLINGUAL | Status: DC | PRN
  Administered 2024-07-12: 0.4 mg via SUBLINGUAL

## 2024-07-12 MED ORDER — IRON SUCROSE 200 MG IVPB - SIMPLE MED
200.0000 mg | Freq: Once | Status: AC
  Administered 2024-07-12: 200 mg via INTRAVENOUS
  Filled 2024-07-12: qty 200

## 2024-07-12 NOTE — Plan of Care (Signed)

## 2024-07-12 NOTE — Progress Notes (Signed)
 Pharmacy Brief Note - Anticoagulation Follow Up:  Verbal order from MD to transition heparin  drip to apixaban  for PE/DVT. MD confirmed he would like loading dose to be given.   -Stop heparin   -Start apixaban  10 mg PO BID x 7 days followed by 5 mg PO BID -CBC with AM labs tomorrow  Ronal CHRISTELLA Rav, PharmD 07/12/24 9:49 AM

## 2024-07-12 NOTE — Plan of Care (Signed)

## 2024-07-12 NOTE — Progress Notes (Signed)
 PHARMACY - ANTICOAGULATION CONSULT NOTE  Pharmacy Consult for heparin  Indication: pulmonary embolus  Allergies[1]  Patient Measurements: Height: 5' 11 (180.3 cm) Weight: 83.4 kg (183 lb 14.4 oz) (bed scale) IBW/kg (Calculated) : 70.8 HEPARIN  DW (KG): 83.4  Vital Signs: Temp: 98.5 F (36.9 C) (01/31 0359) Temp Source: Oral (01/31 0359) BP: 107/65 (01/31 0359) Pulse Rate: 65 (01/31 0359)  Labs: Recent Labs    07/10/24 1151 07/10/24 2119 07/11/24 0719 07/11/24 1641 07/12/24 0702  HGB 5.7*  --  8.1*  --  7.6*  HCT 20.7*  --  27.5*  --  24.7*  PLT 881*  --  811*  --  722*  HEPARINUNFRC  --    < > 0.68 0.56 0.73*  CREATININE 0.62  --  0.64  --  0.61   < > = values in this interval not displayed.    Estimated Creatinine Clearance: 106.6 mL/min (by C-G formula based on SCr of 0.61 mg/dL).   Medical History: History reviewed. No pertinent past medical history.  Medications:  Infusions:   heparin  1,200 Units/hr (07/12/24 0554)   iron  sucrose      Assessment: 38 yof presented to the ED with ShOB. Found to have a PE and R DVT and started on heparin  infusion. Baseline Hgb is low at 5.7 and platelets are elevated. No overt bleeding noted, likely related to iron  deficiency in setting of menometrorrhagia.   Currently deferring heparin  bolus and aiming for lower end of heparin  goal.  07/12/24 Heparin  level now SUPRAtherapeutic on heparin  1200 units/hr Hgb 7.6 - monitor; plt remain elevated No complications of therapy noted  Goal of Therapy:  Heparin  level 0.3-0.5 units/ml due to concern for bleeding risk Monitor platelets by anticoagulation protocol: Yes   Plan:  -Reduce heparin  infusion to 950 units/hr -Recheck heparin  level in 6 hours -Check daily heparin  level and CBC -Continue to monitor for signs and symptoms of bleeding    Eva CHRISTELLA Allis, PharmD, BCPS Secure Chat if ?s 07/12/2024 8:55 AM           [1] No Known Allergies

## 2024-07-13 DIAGNOSIS — I2699 Other pulmonary embolism without acute cor pulmonale: Secondary | ICD-10-CM | POA: Diagnosis not present

## 2024-07-13 LAB — CBC
HCT: 25.6 % — ABNORMAL LOW (ref 36.0–46.0)
Hemoglobin: 7.5 g/dL — ABNORMAL LOW (ref 12.0–15.0)
MCH: 21.2 pg — ABNORMAL LOW (ref 26.0–34.0)
MCHC: 29.3 g/dL — ABNORMAL LOW (ref 30.0–36.0)
MCV: 72.5 fL — ABNORMAL LOW (ref 80.0–100.0)
Platelets: 730 10*3/uL — ABNORMAL HIGH (ref 150–400)
RBC: 3.53 MIL/uL — ABNORMAL LOW (ref 3.87–5.11)
RDW: 33.3 % — ABNORMAL HIGH (ref 11.5–15.5)
WBC: 15 10*3/uL — ABNORMAL HIGH (ref 4.0–10.5)
nRBC: 0 % (ref 0.0–0.2)

## 2024-07-13 MED ORDER — APIXABAN 5 MG PO TABS: ORAL_TABLET | ORAL | 2 refills | Status: AC

## 2024-07-13 MED ORDER — OXYCODONE HCL 5 MG PO TABS: 5.0000 mg | ORAL_TABLET | ORAL | 0 refills | Status: AC | PRN

## 2024-07-13 MED ORDER — FERROUS SULFATE 325 (65 FE) MG PO TABS: 325.0000 mg | ORAL_TABLET | Freq: Every day | ORAL | 0 refills | Status: AC

## 2024-07-13 NOTE — Plan of Care (Signed)

## 2024-07-13 NOTE — Plan of Care (Signed)
   Problem: Education: Goal: Knowledge of General Education information will improve Description: Including pain rating scale, medication(s)/side effects and non-pharmacologic comfort measures Outcome: Progressing   Problem: Clinical Measurements: Goal: Ability to maintain clinical measurements within normal limits will improve Outcome: Progressing   Problem: Nutrition: Goal: Adequate nutrition will be maintained Outcome: Progressing

## 2024-07-13 NOTE — TOC Initial Note (Signed)
 Transition of Care [facility]) - Initial/Assessment Note    Patient Details  Name: Maria Gillespie MRN: [id number] Date of Birth: 09/01/1985  [facility]) CM/SW Contact:    Sonda Manuella Quill, RN Phone Number: 07/13/2024, 1:23 PM  Clinical Narrative:                 Beatris w/ pt over room phone; pt said she lives at home w/ her sons; she plans to return at discharge w/ family support; pt identified POC Maria Gillespie (sister) [phone]; she will provide transportation; insurance/PCP verified; pt denied SDOH risks; she does not have DME, HH services, or home oxygen; no IP CM needs.  Expected Discharge Plan: Home/Self Care Barriers to Discharge: No Barriers Identified   Patient Goals and CMS Choice Patient states their goals for this hospitalization and ongoing recovery are:: home          Expected Discharge Plan and Services   Discharge Planning Services: CM Consult   Living arrangements for the past 2 months: Apartment Expected Discharge Date: 07/13/24               DME Arranged: N/A DME Agency: NA       HH Arranged: NA HH Agency: NA        Prior Living Arrangements/Services Living arrangements for the past 2 months: Apartment Lives with:: Minor Children Patient language and need for interpreter reviewed:: Yes Do you feel safe going back to the place where you live?: Yes      Need for Family Participation in Patient Care: Yes (Comment) Care giver support system in place?: Yes (comment) Current home services:  (n/a) Criminal Activity/Legal Involvement Pertinent to Current Situation/Hospitalization: No - Comment as needed  Activities of Daily Living   ADL Screening (condition at time of admission) Independently performs ADLs?: Yes (appropriate for developmental age) Is the patient deaf or have difficulty hearing?: No Does the patient have difficulty seeing, even when wearing glasses/contacts?: No Does the patient have difficulty concentrating,  remembering, or making decisions?: No  Permission Sought/Granted Permission sought to share information with : Case Manager Permission granted to share information with : Yes, Verbal Permission Granted  Share Information with NAME: Case Manager     Permission granted to share info w Relationship: Maria Gillespie (sister) [phone]     Emotional Assessment Appearance:: Other (Comment Required (unable to assess) Attitude/Demeanor/Rapport: Gracious Affect (typically observed): Accepting Orientation: : Oriented to Self, Oriented to Place, Oriented to  Time, Oriented to Situation Alcohol / Substance Use: Not Applicable Psych Involvement: No (comment)  Admission diagnosis:  Menometrorrhagia [N92.1] Breast nodule [N63.0] Symptomatic anemia [D64.9] Uterine leiomyoma, unspecified location [D25.9] Acute pulmonary embolism without acute cor pulmonale (HCC) [I26.99] Acute pulmonary embolism, unspecified pulmonary embolism type, unspecified whether acute cor pulmonale present [facility]) [I26.99] Patient Active Problem List   Diagnosis Date Noted   Acute pulmonary embolism without acute cor pulmonale (HCC) 07/10/2024   Menometrorrhagia 07/10/2024   Microcytic anemia 07/10/2024   Uterine fibroid 07/10/2024   Breast nodule - right side 07/10/2024   Thrombocytosis 07/10/2024   PCP:  [facility] Pharmacy:   [facility] [address] ([address]), [place] - [address] [place] ([place]) [place] [address] Phone: [phone] Fax: [phone]  [facility] [address] [place] - [address] [place] [address] Phone: [phone] Fax: [phone]     Social Drivers of Health (SDOH) Social History: SDOH Screenings   Food Insecurity: No Food  Insecurity (07/13/2024)  Housing: Low Risk (07/13/2024)  Transportation Needs: No Transportation Needs (07/13/2024)  Utilities: Not At Risk (07/13/2024)  Tobacco Use: High  Risk (07/10/2024)   SDOH Interventions: Food Insecurity Interventions: Intervention Not Indicated, Inpatient TOC Housing Interventions: Intervention Not Indicated, Inpatient TOC Transportation Interventions: Intervention Not Indicated, Inpatient TOC Utilities Interventions: Intervention Not Indicated, Inpatient TOC   Readmission Risk Interventions     No data to display

## 2024-07-13 NOTE — Discharge Summary (Signed)
 Physician Discharge Summary  Maria Gillespie FMW:[id number] DOB: 27-Mar-1986 DOA: 07/10/2024  PCP: [facility]  Admit date: 07/10/2024  Discharge date: 07/13/2024  Admitted From: Home  Disposition:  Home  Recommendations for Outpatient Follow-up:  Follow up with PCP in 1-2 weeks. Please obtain BMP/CBC in one week. Advised to take Eliquis  10 mg twice daily for 6 days to complete 7 days followed by Eliquis  5 mg twice daily for 6 to 9 months for PE and acute DVT. Advised to follow-up with gynecologist for metromenorrhagia.  Home Health:None Equipment/Devices:None  Discharge Condition: Stable CODE STATUS: Full code Diet recommendation: Heart Healthy  Brief Summary / Hospital Course: This 39 yrs old African-American female with history of fibroids for which she has not undergone GYN evaluation, menometrorrhagia over the last year who presented to the ED with sudden onset of chest pain with inspiration that started last night.  Patient states she was not getting comfortable. She reports her menstrual cycle happens every 14 days and last for 7 days.  She has been using tampons almost every 30 minutes.  This has been ongoing for last year.  She was found to have fibroids in June 2024 on a routine CT scan of her abdomen.  She has not contacted GYN so far. She was found to have hemoglobin of 5.7.  MCV 66.6.  D-dimer was elevated. CTA chest ruled in small lingular and left lateral lobe PE. Patient also has right lateral breast nodule which requires outpatient mammogram.  Patient was admitted for further evaluation and started on IV heparin .  Echocardiogram shows LVEF 55 to 60%, no right heart strain. Patient successfully weaned down to room air. Also successfully weaned down from IV heparin  to Eliquis . Venous duplex positive for acute DVT.  Patient feels better and want to be discharged home.  Advised to follow-up with primary care physician.  Discharge Diagnoses:  Principal Problem:    Acute pulmonary embolism without acute cor pulmonale (HCC) Active Problems:   Menometrorrhagia   Microcytic anemia   Uterine fibroid   Breast nodule - right side   Thrombocytosis  Acute hypoxic respiratory failure: Acute pulmonary embolism without acute cor pulmonale: Acute right lower extremity DVT: Patient presented with intermittent chest pain with inspiration. CTA chest ruled in pulmonary embolism. PCCM recommended starting IV heparin  protocol.  Patient continued on IV heparin . 2D echo shows LVEF 55 to 60%, no right heart strain. Venous duplex confirms acute DVT in the right peroneal veins.   Likely plan to transition to DOAC before discharge.   Unprovoked PE. Will only need 3-6 months of systemic anticoagulation. Continue supplemental oxygen and wean as tolerated. She is successfully weaned down to room air. Successfully transitioned to Eliquis .   Microcytic anemia: Iron  deficiency anemia: Likely iron  deficiency based on her menometrorrhagia.   Found to have hemoglobin 5.3.  Status post 2 unit PRBC.  Hemoglobin improved to 8.1.  Continue to monitor H&H. Continue iron  infusion.   Menometrorrhagia: Patient has had  history of about 1 year of menometrorrhagia.   She will need outpatient GYN for management along with outpatient management of her right breast nodule that will include mammogram plus or minus ultrasound.   Thrombocytosis: Her thrombocytosis may be reactive.   She may need outpatient heme-onc referral if her platelet count does not improve.   Breast nodule - right side Will need outpatient referral to GYN for her dysfunctional uterine bleeding and right breast nodule.   She will need outpatient mammogram.   Uterine fibroid:  Will need outpatient referral to GYN for her dysfunctional uterine bleeding and right breast nodule.    Discharge Instructions  Discharge Instructions     Amb Referral to Intravenous Iron  Therapy   Complete by: As directed    You  have been referred to [facility] Infusion team for IV Iron  Infusions. The infusion pharmacy team will reach out to you with appointment information.    Primary Diagnosis Code for IV Iron : D50.9 - Iron  deficiency Anemia   Secondary diagnosis code for IV iron : Other   Comment: n/a   Ambulatory referral to Obstetrics / Gynecology   Complete by: As directed    Menometrorrhagia, uterine fibroids and right breast nodule that will need further workup   Call MD for:  difficulty breathing, headache or visual disturbances   Complete by: As directed    Call MD for:  persistant dizziness or light-headedness   Complete by: As directed    Call MD for:  persistant nausea and vomiting   Complete by: As directed    Diet general   Complete by: As directed    Discharge instructions   Complete by: As directed    Advised to follow-up with primary care physician in 1 week. Advised to take Eliquis  10 mg twice daily for 6 days to complete 7 days followed by Eliquis  5 mg twice daily for 6 to 9 months for PE and acute DVT. Advised to follow-up with gynecologist for metromenorrhagia.   Increase activity slowly   Complete by: As directed       Allergies as of 07/13/2024   No Known Allergies      Medication List     TAKE these medications    acetaminophen  500 MG tablet Commonly known as: TYLENOL  Take 1,000 mg by mouth every 6 (six) hours as needed for mild pain (pain score 1-3) or moderate pain (pain score 4-6).   apixaban  5 MG Tabs tablet Commonly known as: ELIQUIS  Take 2 tablets (10 mg total) by mouth 2 (two) times daily for 6 days, THEN 1 tablet (5 mg total) 2 (two) times daily. Start taking on: July 13, 2024   ferrous sulfate  325 (65 FE) MG tablet Take 1 tablet (325 mg total) by mouth daily.   ibuprofen  200 MG tablet Commonly known as: ADVIL  Take 800 mg by mouth every 6 (six) hours as needed for mild pain (pain score 1-3) or moderate pain (pain score 4-6).   oxyCODONE  5 MG immediate  release tablet Commonly known as: Oxy IR/ROXICODONE  Take 1 tablet (5 mg total) by mouth every 4 (four) hours as needed for up to 3 days for moderate pain (pain score 4-6).        Follow-up Information     [facility] .   Specialty: General Practice Contact information: [address] [place] [address] [phone]         [facility] Emergency Department at [facility] .   Specialty: Emergency Medicine Contact information: [address] [place]  [address] [phone]        [facility]. Schedule an appointment as soon as possible for a visit in 1 week.   Specialty: Obstetrics and Gynecology Why: Follow-up with OB/[facility]N [facility] regarding your iron  deficiency anemia Contact information: [address] [place]  [address] [phone]        [facility]monary Care at [facility] Follow up in 1 week(s).  Specialty: Pulmonology Contact information: [address] [place]  [address] [phone] Additional information: [address]  [address]  [place], [place] [address]               Allergies[1]  Consultations: None   Procedures/Studies: ECHOCARDIOGRAM COMPLETE Result Date: 07/11/2024    ECHOCARDIOGRAM REPORT   Patient Name:   Maria Gillespie Date of Exam: 07/11/2024 Medical Rec #:  [id number]    Height:       71.0 in Accession #:    [id number]   Weight:       183.9 lb Date of Birth:  1985/08/06    BSA:          2.035 m Patient Age:    38 years     BP:           113/66 mmHg Patient Gender: F            HR:           77 bpm. Exam Location:  Inpatient Procedure: 2D Echo, Cardiac Doppler and Color Doppler (Both Spectral and Color            Flow Doppler were utilized during procedure). Indications:    I26.02 Pulmonary embolus  History:        Patient has no prior history of Echocardiogram  examinations.                 Signs/Symptoms:Chest Pain; Risk Factors:Current Smoker. No prior                 cardiac history.  Sonographer:    Ellouise Mose RDCS Referring Phys: [id number] ERIC CHEN  Sonographer Comments: Technically difficult study due to poor echo windows, suboptimal apical window and suboptimal subcostal window. Patient in pain, had difficulty turning. IMPRESSIONS  1. Left ventricular ejection fraction, by estimation, is 55 to 60%. The left ventricle has normal function. The left ventricle has no regional wall motion abnormalities. Left ventricular diastolic parameters were normal.  2. Right ventricular systolic function is normal. The right ventricular size is normal. There is moderately elevated pulmonary artery systolic pressure.  3. The mitral valve is normal in structure. Trivial mitral valve regurgitation. No evidence of mitral stenosis.  4. The aortic valve is tricuspid. Aortic valve regurgitation is not visualized. No aortic stenosis is present.  5. The inferior vena cava is dilated in size with <50% respiratory variability, suggesting right atrial pressure of 15 mmHg. Comparison(s): No prior Echocardiogram. Conclusion(s)/Recommendation(s): Elevated pulmonary pressures with preserved RV function. FINDINGS  Left Ventricle: Left ventricular ejection fraction, by estimation, is 55 to 60%. The left ventricle has normal function. The left ventricle has no regional wall motion abnormalities. The left ventricular internal cavity size was normal in size. There is  no left ventricular hypertrophy. Left ventricular diastolic parameters were normal. Right Ventricle: The right ventricular size is normal. No increase in right ventricular wall thickness. Right ventricular systolic function is normal. There is moderately elevated pulmonary artery systolic pressure. The tricuspid regurgitant velocity is 2.81 m/s, and with an assumed right atrial pressure of 15 mmHg, the estimated right ventricular systolic  pressure is 46.6 mmHg. Left Atrium: Left atrial size was normal in size. Right Atrium: Right atrial size was normal in size. Pericardium: There is no evidence of pericardial effusion. Mitral Valve: The mitral valve is normal in structure. Trivial mitral valve regurgitation. No evidence of mitral valve stenosis. Tricuspid Valve: The tricuspid valve is normal in structure. Tricuspid  valve regurgitation is mild . No evidence of tricuspid stenosis. Aortic Valve: The aortic valve is tricuspid. Aortic valve regurgitation is not visualized. No aortic stenosis is present. Pulmonic Valve: The pulmonic valve was normal in structure. Pulmonic valve regurgitation is trivial. No evidence of pulmonic stenosis. Aorta: The aortic root and ascending aorta are structurally normal, with no evidence of dilitation. Venous: The pulmonary veins were not well visualized. The inferior vena cava is dilated in size with less than 50% respiratory variability, suggesting right atrial pressure of 15 mmHg. IAS/Shunts: No atrial level shunt detected by color flow Doppler.  LEFT VENTRICLE PLAX 2D LVIDd:         5.20 cm      Diastology LVIDs:         3.20 cm      LV e' medial:    9.90 cm/s LV PW:         0.90 cm      LV E/e' medial:  10.2 LV IVS:        0.70 cm      LV e' lateral:   14.30 cm/s LVOT diam:     2.20 cm      LV E/e' lateral: 7.0 LV SV:         93 LV SV Index:   46 LVOT Area:     3.80 cm  LV Volumes (MOD) LV vol d, MOD A2C: 101.0 ml LV vol d, MOD A4C: 78.9 ml LV vol s, MOD A2C: 41.7 ml LV vol s, MOD A4C: 24.7 ml LV SV MOD A2C:     59.3 ml LV SV MOD A4C:     78.9 ml LV SV MOD BP:      56.8 ml RIGHT VENTRICLE             IVC RV S prime:     16.50 cm/s  IVC diam: 2.30 cm TAPSE (M-mode): 2.9 cm LEFT ATRIUM             Index        RIGHT ATRIUM           Index LA diam:        3.80 cm 1.87 cm/m   RA Area:     14.60 cm LA Vol (A2C):   40.0 ml 19.66 ml/m  RA Volume:   37.40 ml  18.38 ml/m LA Vol (A4C):   46.4 ml 22.80 ml/m LA Biplane Vol:  43.1 ml 21.18 ml/m  AORTIC VALVE             PULMONIC VALVE LVOT Vmax:   132.00 cm/s PR End Diast Vel: 2.02 msec LVOT Vmean:  90.600 cm/s LVOT VTI:    0.244 m  AORTA Ao Root diam: 2.60 cm Ao Asc diam:  2.90 cm MITRAL VALVE                TRICUSPID VALVE MV Area (PHT): 3.76 cm     TR Peak grad:   31.6 mmHg MV Decel Time: 202 msec     TR Vmax:        281.00 cm/s MV E velocity: 100.67 cm/s MV A velocity: 70.90 cm/s   SHUNTS MV E/A ratio:  1.42         Systemic VTI:  0.24 m                             Systemic Diam: 2.20 cm Georganna Archer Electronically signed  by Georganna Archer Signature Date/Time: 07/11/2024/6:20:06 PM    Final    VAS US  LOWER EXTREMITY VENOUS (DVT) Result Date: 07/11/2024  Lower Venous DVT Study Patient Name:  Maria Gillespie  Date of Exam:   07/11/2024 Medical Rec #: [id number]     Accession #:    [id number] Date of Birth: Jan 15, 1986     Patient Gender: F Patient Age:   42 years Exam Location:  [facility] Procedure:      VAS US  LOWER EXTREMITY VENOUS (DVT) Referring Phys: ERIC CHEN --------------------------------------------------------------------------------  Indications: SOB, and pulmonary embolism.  Risk Factors: Confirmed PE. Anticoagulation: Heparin . Comparison Study: No priors. Performing Technologist: Ricka Sturdivant-Jones RDMS, RVT  Examination Guidelines: A complete evaluation includes B-mode imaging, spectral Doppler, color Doppler, and power Doppler as needed of all accessible portions of each vessel. Bilateral testing is considered an integral part of a complete examination. Limited examinations for reoccurring indications may be performed as noted. The reflux portion of the exam is performed with the patient in reverse Trendelenburg.  +---------+---------------+---------+-----------+----------+--------------+ RIGHT    CompressibilityPhasicitySpontaneityPropertiesThrombus Aging +---------+---------------+---------+-----------+----------+--------------+ CFV      Full            Yes      Yes                                 +---------+---------------+---------+-----------+----------+--------------+ SFJ      Full                                                        +---------+---------------+---------+-----------+----------+--------------+ FV Prox  Full                                                        +---------+---------------+---------+-----------+----------+--------------+ FV Mid   Full           Yes      Yes                                 +---------+---------------+---------+-----------+----------+--------------+ FV DistalFull                                                        +---------+---------------+---------+-----------+----------+--------------+ PFV      Full                                                        +---------+---------------+---------+-----------+----------+--------------+ POP      Full           Yes      Yes                                 +---------+---------------+---------+-----------+----------+--------------+  PTV      Full                                                        +---------+---------------+---------+-----------+----------+--------------+ PERO     Partial                                      Acute          +---------+---------------+---------+-----------+----------+--------------+ Acute DVT in one of the paired peroneal veins.  +---------+---------------+---------+-----------+----------+--------------+ LEFT     CompressibilityPhasicitySpontaneityPropertiesThrombus Aging +---------+---------------+---------+-----------+----------+--------------+ CFV      Full           Yes      Yes                                 +---------+---------------+---------+-----------+----------+--------------+ SFJ      Full                                                        +---------+---------------+---------+-----------+----------+--------------+ FV Prox  Full                                                         +---------+---------------+---------+-----------+----------+--------------+ FV Mid   Full           Yes      Yes                                 +---------+---------------+---------+-----------+----------+--------------+ FV DistalFull                                                        +---------+---------------+---------+-----------+----------+--------------+ PFV      Full                                                        +---------+---------------+---------+-----------+----------+--------------+ POP      Full           Yes      Yes                                 +---------+---------------+---------+-----------+----------+--------------+ PTV      Full                                                        +---------+---------------+---------+-----------+----------+--------------+  PERO     Full                                                        +---------+---------------+---------+-----------+----------+--------------+     Summary: BILATERAL: -No evidence of popliteal cyst, bilaterally. RIGHT: - Findings consistent with acute deep vein thrombosis involving the right peroneal veins.  - All other veins are patent without evidence of DVT.  LEFT: - There is no evidence of deep vein thrombosis in the lower extremity.  *See table(s) above for measurements and observations. Electronically signed by Gaile New MD on 07/11/2024 at 3:31:15 PM.    Final    CT Angio Chest PE W and/or Wo Contrast Addendum Date: 07/10/2024 ADDENDUM REPORT: 07/10/2024 13:57 ADDENDUM: Case discussed with Dr. RUTHE at approximately 1:50 p.m. Electronically Signed   By: Rockey Kilts M.D.   On: 07/10/2024 13:57   Result Date: 07/10/2024 CLINICAL DATA:  Chest pain beginning this morning. EXAM: CT ANGIOGRAPHY CHEST WITH CONTRAST TECHNIQUE: Multidetector CT imaging of the chest was performed using the standard protocol during bolus  administration of intravenous contrast. Multiplanar CT image reconstructions and MIPs were obtained to evaluate the vascular anatomy. RADIATION DOSE REDUCTION: This exam was performed according to the departmental dose-optimization program which includes automated exposure control, adjustment of the mA and/or kV according to patient size and/or use of iterative reconstruction technique. CONTRAST:  75mL OMNIPAQUE  IOHEXOL  350 MG/ML SOLN COMPARISON:  Plain film of earlier today. FINDINGS: Cardiovascular: The quality of this exam for evaluation of pulmonary embolism is sufficient. Small volume but definite subsegmental filling defects within lingular and left lower lobe pulmonary artery branches including on image 138/304. Also coronal image 92. Normal aortic caliber. Normal heart size, without pericardial effusion. Mediastinum/Nodes: No mediastinal or hilar adenopathy. Minimal residual thymic tissue in the anterior mediastinum is normal for age. Lungs/Pleura: No pleural fluid. Mild subsegmental atelectasis in the inferior lingula and less so lower lobes bilaterally. Upper Abdomen: No significant findings. Musculoskeletal: Right lateral breast 12 mm soft tissue nodule on image 48/302 and coronal image 127. No acute osseous abnormality. Review of the MIP images confirms the above findings. IMPRESSION: 1. Small volume inferior lingula and lateral left lower lobe pulmonary emboli. 2. Right lateral breast 12 mm nodule warrants further evaluation with diagnostic mammogram and ultrasound. 3. A return call from the emergency room clinical service is pending. Electronically Signed: By: Rockey Kilts M.D. On: 07/10/2024 13:35   DG Chest Portable 1 View Result Date: 07/10/2024 CLINICAL DATA:  Chest pain. EXAM: PORTABLE CHEST 1 VIEW COMPARISON:  None Available. FINDINGS: The heart size and mediastinal contours are within normal limits. Both lungs are clear. The visualized skeletal structures are unremarkable. IMPRESSION: No  active disease. Electronically Signed   By: Vanetta Chou M.D.   On: 07/10/2024 13:34    Subjective: Patient was seen and examined at bedside.  Overnight events noted. Patient reports doing much better and wants to be discharged home.  Discharge Exam: Vitals:   07/12/24 1945 07/13/24 0403  BP: 106/61 104/64  Pulse: 86 75  Resp: 20 16  Temp: 100.2 F (37.9 C) 99.7 F (37.6 C)  SpO2:  100%   Vitals:   07/12/24 1456 07/12/24 1513 07/12/24 1945 07/13/24 0403  BP: 120/76 112/64 106/61 104/64  Pulse: 87 86 86 75  Resp:  20 16  Temp: 99 F (37.2 C) 99.6 F (37.6 C) 100.2 F (37.9 C) 99.7 F (37.6 C)  TempSrc: Oral Oral Oral   SpO2:    100%  Weight:      Height:        General: Pt is alert, awake, not in acute distress Cardiovascular: RRR, S1/S2 +, no rubs, no gallops Respiratory: CTA bilaterally, no wheezing, no rhonchi Abdominal: Soft, NT, ND, bowel sounds + Extremities: no edema, no cyanosis    The results of significant diagnostics from this hospitalization (including imaging, microbiology, ancillary and laboratory) are listed below for reference.     Microbiology: No results found for this or any previous visit (from the past 240 hours).   Labs: BNP (last 3 results) No results for input(s): BNP in the last 8760 hours. Basic Metabolic Panel: Recent Labs  Lab 07/10/24 1151 07/11/24 0719 07/12/24 0702  NA 139 135 138  K 3.7 3.4* 3.7  CL 105 101 105  CO2 23 23 26   GLUCOSE 87 86 110*  BUN 9 6 8   CREATININE 0.62 0.64 0.61  CALCIUM 8.8* 9.2 8.9  MG  --   --  2.3  PHOS  --   --  3.9   Liver Function Tests: Recent Labs  Lab 07/10/24 1151 07/11/24 0719  AST 15 31  ALT 10 12  ALKPHOS 57 66  BILITOT 0.3 0.7  PROT 8.0 8.0  ALBUMIN 4.2 4.1   Recent Labs  Lab 07/10/24 1151  LIPASE 23   No results for input(s): AMMONIA in the last 168 hours. CBC: Recent Labs  Lab 07/10/24 1151 07/11/24 0719 07/12/24 0702 07/13/24 0718  WBC 15.9* 16.5*  15.2* 15.0*  NEUTROABS 11.5* 11.2*  --   --   HGB 5.7* 8.1* 7.6* 7.5*  HCT 20.7* 27.5* 24.7* 25.6*  MCV 66.6* 72.0* 70.4* 72.5*  PLT 881* 811* 722* 730*   Cardiac Enzymes: No results for input(s): CKTOTAL, CKMB, CKMBINDEX, TROPONINI in the last 168 hours. BNP: Invalid input(s): POCBNP CBG: No results for input(s): GLUCAP in the last 168 hours. D-Dimer No results for input(s): DDIMER in the last 72 hours. Hgb A1c No results for input(s): HGBA1C in the last 72 hours. Lipid Profile No results for input(s): CHOL, HDL, LDLCALC, TRIG, CHOLHDL, LDLDIRECT in the last 72 hours. Thyroid function studies No results for input(s): TSH, T4TOTAL, T3FREE, THYROIDAB in the last 72 hours.  Invalid input(s): FREET3 Anemia work up No results for input(s): VITAMINB12, FOLATE, FERRITIN, TIBC, IRON , RETICCTPCT in the last 72 hours.  Urinalysis    Component Value Date/Time   COLORURINE YELLOW (A) 11/25/2022 0901   APPEARANCEUR CLEAR (A) 11/25/2022 0901   APPEARANCEUR Clear 10/28/2013 1716   LABSPEC 1.013 11/25/2022 0901   LABSPEC 1.017 10/28/2013 1716   PHURINE 8.0 11/25/2022 0901   GLUCOSEU NEGATIVE 11/25/2022 0901   GLUCOSEU Negative 10/28/2013 1716   HGBUR SMALL (A) 11/25/2022 0901   BILIRUBINUR NEGATIVE 11/25/2022 0901   BILIRUBINUR Negative 10/28/2013 1716   KETONESUR 20 (A) 11/25/2022 0901   PROTEINUR NEGATIVE 11/25/2022 0901   NITRITE NEGATIVE 11/25/2022 0901   LEUKOCYTESUR NEGATIVE 11/25/2022 0901   LEUKOCYTESUR 2+ 10/28/2013 1716   Sepsis Labs Recent Labs  Lab 07/10/24 1151 07/11/24 0719 07/12/24 0702 07/13/24 0718  WBC 15.9* 16.5* 15.2* 15.0*   Microbiology No results found for this or any previous visit (from the past 240 hours).   Time coordinating discharge: Over 30 minutes  SIGNED:   Darcel Dawley, MD  [facility] 07/13/2024, 1:38 PM Pager   If 7PM-7AM, please contact night-coverage     [1] No Known  Allergies

## 2024-07-13 NOTE — Plan of Care (Signed)
" °  Problem: Education: Goal: Knowledge of General Education information will improve Description: Including pain rating scale, medication(s)/side effects and non-pharmacologic comfort measures 07/13/2024 1311 by Talina Pleitez C, RN Outcome: Adequate for Discharge 07/13/2024 1303 by Gordon Carolyn BROCKS, RN Outcome: Progressing   Problem: Health Behavior/Discharge Planning: Goal: Ability to manage health-related needs will improve 07/13/2024 1311 by Akera Snowberger C, RN Outcome: Adequate for Discharge 07/13/2024 1303 by Gordon Carolyn BROCKS, RN Outcome: Progressing   Problem: Clinical Measurements: Goal: Ability to maintain clinical measurements within normal limits will improve 07/13/2024 1311 by Joshia Kitchings C, RN Outcome: Adequate for Discharge 07/13/2024 1303 by Gordon Carolyn BROCKS, RN Outcome: Progressing Goal: Will remain free from infection 07/13/2024 1311 by Kevonna Nolte C, RN Outcome: Adequate for Discharge 07/13/2024 1303 by Tambi Thole C, RN Outcome: Progressing Goal: Diagnostic test results will improve 07/13/2024 1311 by Brantleigh Mifflin C, RN Outcome: Adequate for Discharge 07/13/2024 1303 by Eleonor Ocon C, RN Outcome: Progressing Goal: Respiratory complications will improve 07/13/2024 1311 by Bertina Guthridge C, RN Outcome: Adequate for Discharge 07/13/2024 1303 by Nykeria Mealing C, RN Outcome: Progressing Goal: Cardiovascular complication will be avoided 07/13/2024 1311 by Aimie Wagman C, RN Outcome: Adequate for Discharge 07/13/2024 1303 by Monty Mccarrell C, RN Outcome: Progressing   Problem: Activity: Goal: Risk for activity intolerance will decrease 07/13/2024 1311 by Guelda Batson C, RN Outcome: Adequate for Discharge 07/13/2024 1303 by Gordon Carolyn BROCKS, RN Outcome: Progressing   Problem: Nutrition: Goal: Adequate nutrition will be maintained 07/13/2024 1311 by Joselynne Killam C, RN Outcome: Adequate for Discharge 07/13/2024 1303 by Gordon Carolyn BROCKS, RN Outcome:  Progressing   Problem: Coping: Goal: Level of anxiety will decrease 07/13/2024 1311 by Carole Doner C, RN Outcome: Adequate for Discharge 07/13/2024 1303 by Gordon Carolyn BROCKS, RN Outcome: Progressing   Problem: Elimination: Goal: Will not experience complications related to bowel motility 07/13/2024 1311 by Carin Shipp C, RN Outcome: Adequate for Discharge 07/13/2024 1303 by Gordon Carolyn BROCKS, RN Outcome: Progressing Goal: Will not experience complications related to urinary retention 07/13/2024 1311 by Unnamed Hino C, RN Outcome: Adequate for Discharge 07/13/2024 1303 by Dacian Orrico C, RN Outcome: Progressing   Problem: Pain Managment: Goal: General experience of comfort will improve and/or be controlled 07/13/2024 1311 by Kyair Ditommaso C, RN Outcome: Adequate for Discharge 07/13/2024 1303 by Rowin Bayron C, RN Outcome: Progressing   Problem: Safety: Goal: Ability to remain free from injury will improve 07/13/2024 1311 by Rydan Gulyas C, RN Outcome: Adequate for Discharge 07/13/2024 1303 by Gordon Carolyn BROCKS, RN Outcome: Progressing   Problem: Skin Integrity: Goal: Risk for impaired skin integrity will decrease 07/13/2024 1311 by Suraiya Dickerson C, RN Outcome: Adequate for Discharge 07/13/2024 1303 by Jamilyn Pigeon C, RN Outcome: Progressing   "

## 2024-07-14 ENCOUNTER — Telehealth (HOSPITAL_COMMUNITY): Payer: Self-pay

## 2024-07-14 ENCOUNTER — Other Ambulatory Visit (HOSPITAL_COMMUNITY): Payer: Self-pay

## 2024-07-14 ENCOUNTER — Telehealth (HOSPITAL_COMMUNITY): Payer: Self-pay | Admitting: Pharmacist

## 2024-07-14 DIAGNOSIS — D509 Iron deficiency anemia, unspecified: Secondary | ICD-10-CM | POA: Insufficient documentation

## 2024-07-14 LAB — BPAM RBC
Blood Product Expiration Date: 202602172359
Blood Product Expiration Date: 202602182359
ISSUE DATE / TIME: 202601292224
ISSUE DATE / TIME: 202601300119
Unit Type and Rh: 7300
Unit Type and Rh: 7300

## 2024-07-14 LAB — TYPE AND SCREEN
ABO/RH(D): B POS
Antibody Screen: NEGATIVE
Unit division: 0
Unit division: 0

## 2024-07-14 NOTE — Telephone Encounter (Signed)
 Patient referred to infusion pharmacy team for ambulatory infusion of IV iron .  Insurance - [employer] care - Site of care: [facility] Dx code - D64.9/D50.9 IV Iron  Therapy - Venofer  200mg  IV x 4 doses. Venofer  200mg  IV x 1 dose received inpatient on 07/12/2024. Infusion appointments - Scheduling team will schedule patient as soon as possible.    Sherry Pennant, PharmD, MPH, BCPS, CPP Clinical Pharmacist

## 2024-07-17 ENCOUNTER — Ambulatory Visit: Admission: RE | Admit: 2024-07-17 | Discharge: 2024-07-17 | Attending: Pulmonary Disease

## 2024-07-17 VITALS — BP 108/62 | HR 67 | Temp 97.1°F | Resp 18

## 2024-07-17 DIAGNOSIS — D509 Iron deficiency anemia, unspecified: Secondary | ICD-10-CM

## 2024-07-17 MED ORDER — IRON SUCROSE 200 MG IVPB - SIMPLE MED
200.0000 mg | Freq: Once | Status: AC
  Administered 2024-07-17: 200 mg via INTRAVENOUS
  Filled 2024-07-17: qty 200
